# Patient Record
Sex: Female | Born: 1975 | Race: White | Hispanic: No | Smoking: Former smoker
Health system: Southern US, Community
[De-identification: ages and names within clinical notes are randomized; demographics above are authoritative.]

## PROBLEM LIST (undated history)

## (undated) DIAGNOSIS — M549 Dorsalgia, unspecified: Secondary | ICD-10-CM

## (undated) DIAGNOSIS — H18509 Unspecified hereditary corneal dystrophies, unspecified eye: Secondary | ICD-10-CM

## (undated) DIAGNOSIS — R51 Headache: Secondary | ICD-10-CM

## (undated) DIAGNOSIS — K219 Gastro-esophageal reflux disease without esophagitis: Secondary | ICD-10-CM

## (undated) DIAGNOSIS — R519 Headache, unspecified: Secondary | ICD-10-CM

## (undated) DIAGNOSIS — M1990 Unspecified osteoarthritis, unspecified site: Secondary | ICD-10-CM

## (undated) DIAGNOSIS — I10 Essential (primary) hypertension: Secondary | ICD-10-CM

## (undated) HISTORY — PX: EYE SURGERY: SHX253

## (undated) HISTORY — PX: CARPAL TUNNEL RELEASE: SHX101

## (undated) HISTORY — PX: BACK SURGERY: SHX140

## (undated) HISTORY — PX: ABDOMINAL HYSTERECTOMY: SHX81

---
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
## 2005-07-30 ENCOUNTER — Ambulatory Visit: Payer: Self-pay | Admitting: Obstetrics and Gynecology

## 2005-08-21 ENCOUNTER — Emergency Department: Payer: Self-pay | Admitting: Emergency Medicine

## 2005-09-18 ENCOUNTER — Ambulatory Visit: Payer: Self-pay | Admitting: Specialist

## 2006-05-18 ENCOUNTER — Encounter: Admission: RE | Admit: 2006-05-18 | Discharge: 2006-05-18 | Payer: Self-pay | Admitting: Neurological Surgery

## 2006-07-02 ENCOUNTER — Inpatient Hospital Stay (HOSPITAL_COMMUNITY): Admission: RE | Admit: 2006-07-02 | Discharge: 2006-07-04 | Payer: Self-pay | Admitting: Neurological Surgery

## 2006-08-03 ENCOUNTER — Encounter: Admission: RE | Admit: 2006-08-03 | Discharge: 2006-08-03 | Payer: Self-pay | Admitting: Neurological Surgery

## 2006-10-11 ENCOUNTER — Encounter: Admission: RE | Admit: 2006-10-11 | Discharge: 2006-10-11 | Payer: Self-pay | Admitting: Neurological Surgery

## 2007-08-24 IMAGING — CR DG LUMBAR SPINE 2-3V
3 series · 3 of 3 positions shown · non-contrast
Comparison: none

CLINICAL DATA: Posterior laminectomy and fusion, L4-5, with low back and left leg pain.
LUMBAR SPINE ? TWO VIEWS:

[view not recorded (1 of 3)]
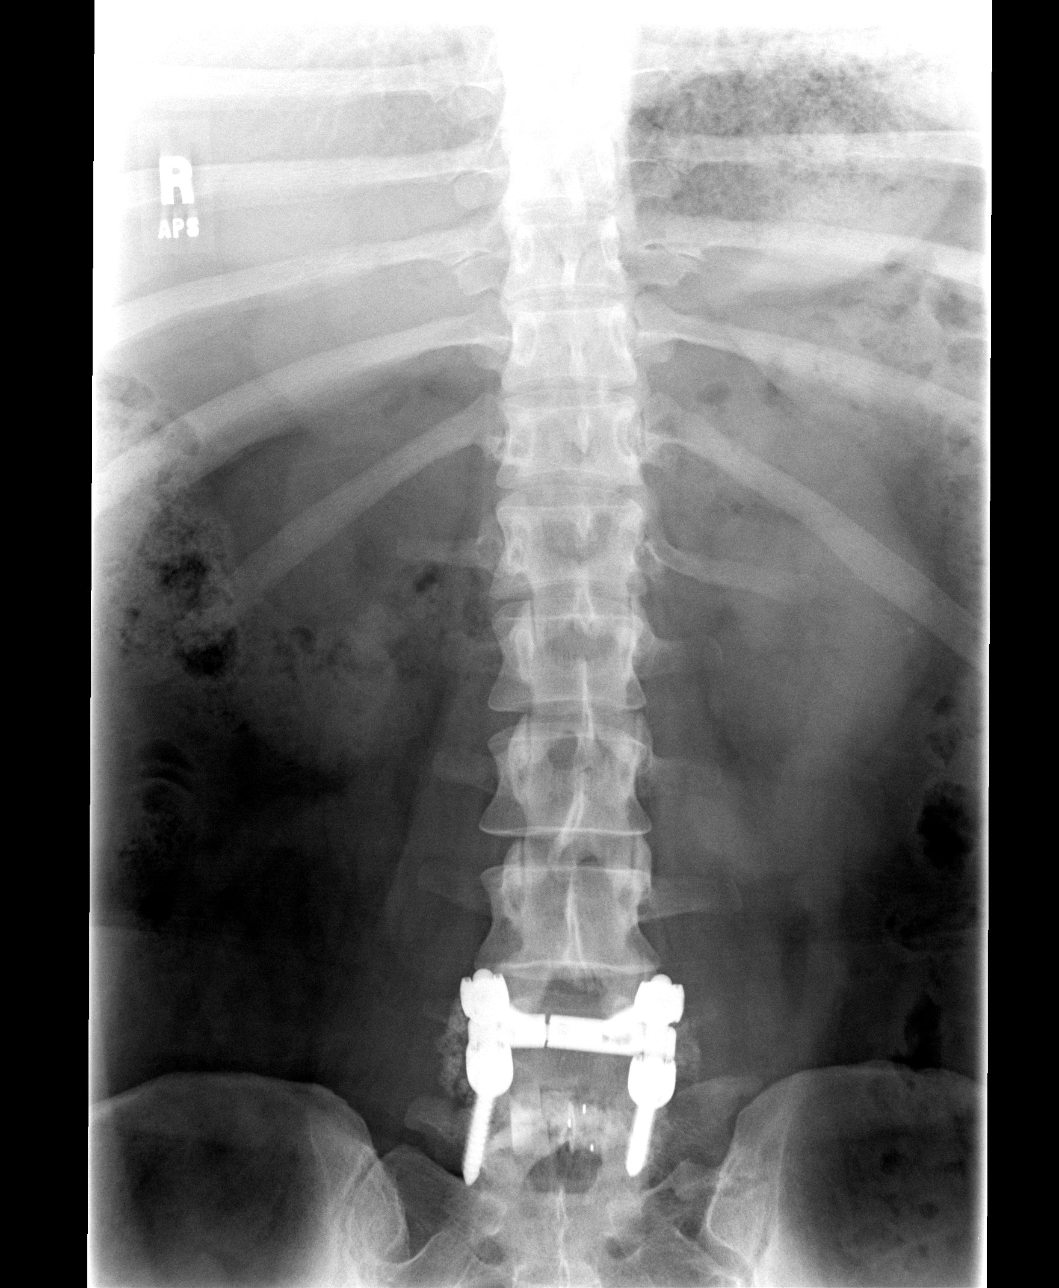

[view not recorded (2 of 3)]
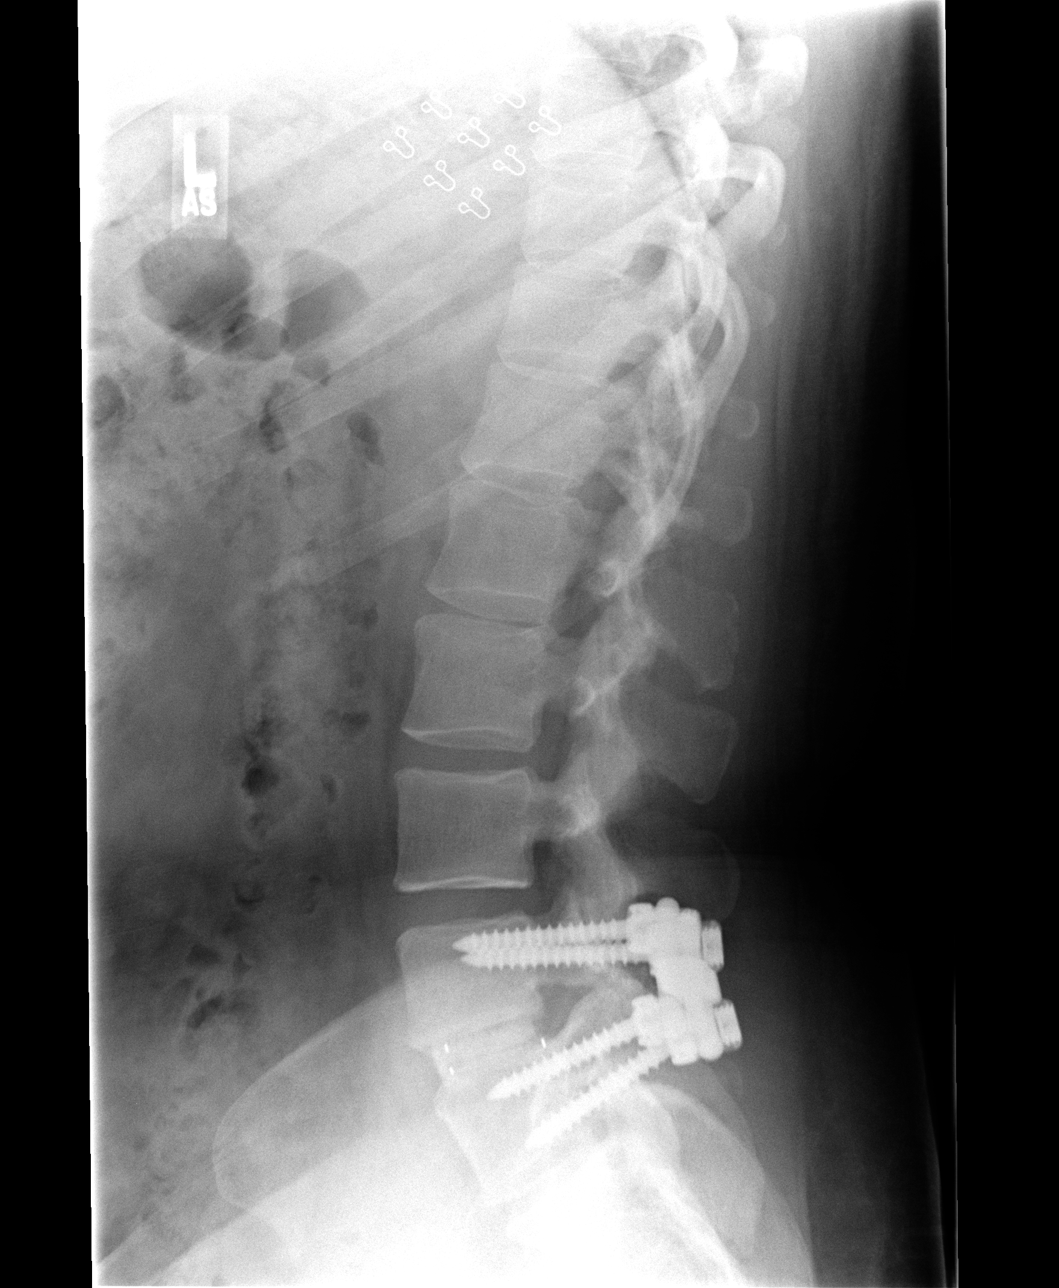

[view not recorded (3 of 3)]
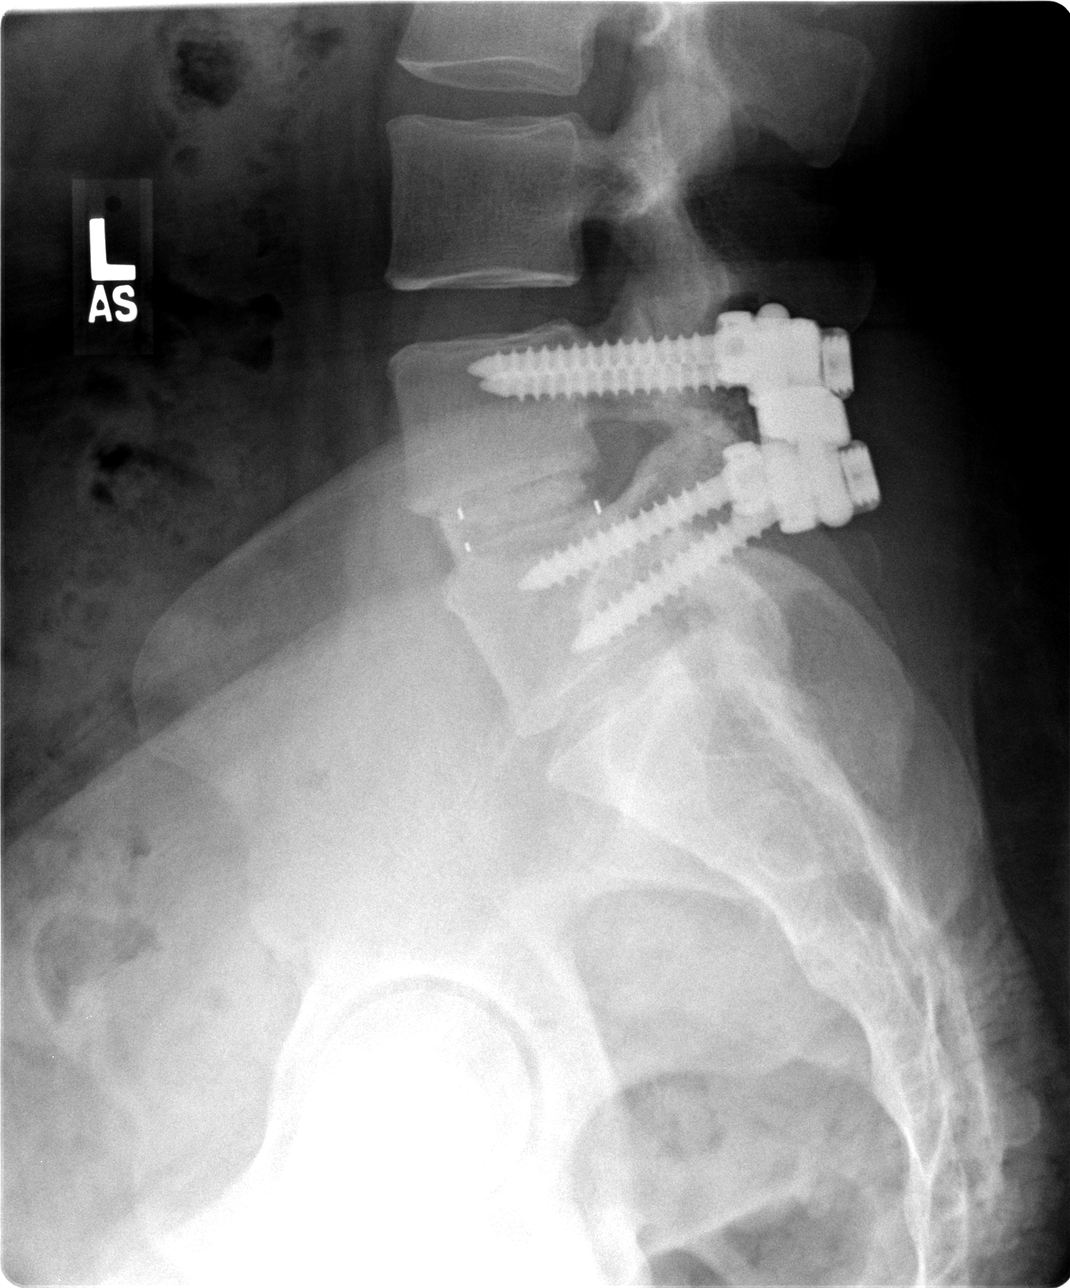

[3 of 3 positions shown; findings below may reference images not displayed]

FINDINGS: Posterior laminectomy with transpedicular screws well placed without surrounding osteolysis at L4-5 noted.  L4-5 interbody bone plug appears normally placed.  Congenitally smaller L5-S1 and/or degenerative disk disease L5-S1 noted.  Remaining lumbar disk spaces and posterior vertebral alignment are normally maintained.
IMPRESSION: 1.  Satisfactory appearing posterior laminectomy and interbody fusion L4-5.
2.  Stable smaller L5-S1 disk.
3.  Otherwise negative.

## 2007-11-04 ENCOUNTER — Emergency Department: Payer: Self-pay

## 2007-11-04 ENCOUNTER — Other Ambulatory Visit: Payer: Self-pay

## 2008-03-06 ENCOUNTER — Emergency Department (HOSPITAL_COMMUNITY): Admission: EM | Admit: 2008-03-06 | Discharge: 2008-03-06 | Payer: Self-pay | Admitting: Emergency Medicine

## 2008-07-31 ENCOUNTER — Emergency Department (HOSPITAL_COMMUNITY): Admission: EM | Admit: 2008-07-31 | Discharge: 2008-07-31 | Payer: Self-pay | Admitting: Emergency Medicine

## 2008-11-19 ENCOUNTER — Emergency Department (HOSPITAL_COMMUNITY): Admission: EM | Admit: 2008-11-19 | Discharge: 2008-11-19 | Payer: Self-pay | Admitting: Emergency Medicine

## 2009-09-19 ENCOUNTER — Emergency Department: Payer: Self-pay | Admitting: Emergency Medicine

## 2010-04-22 ENCOUNTER — Emergency Department (HOSPITAL_COMMUNITY)
Admission: EM | Admit: 2010-04-22 | Discharge: 2010-04-22 | Payer: Self-pay | Source: Home / Self Care | Admitting: Emergency Medicine

## 2010-09-12 NOTE — Discharge Summary (Signed)
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   PROCEDURE:  Posterior lumbar interbody fusion L4-5.   BRIEF HISTORY OF PRESENT ILLNESS:  Ms. Gritz is a 35 year old female who  was referred with severe back pain.  She had undergone a diskogram,  which suggested diskogenic back pain at L4-5 with concordant pain at  that level and non-concordant pain at control level.  She had an MRI and  a CT scan which showed a large paracentral left-sided disk herniation  with central canal stenosis and facet arthropathy.  I recommended a  posterior lumbar interbody fusion.  She understood the risks, benefits,  and suspected outcome and wished to proceed.   HOSPITAL COURSE:  The patient was admitted on July 02, 2006, taken to  the operating room where she underwent a posterior lumbar interbody  fusion L4-5.  The patient tolerated the procedure well and was taken to  the recovery room and then to the floor in stable condition.  For  details of the operative procedure, please see the dictated operative  note.  The patient's hospital course was routine.  There were no  complications.  She remained in the hospital for 2 days.  She was on  Dilaudid p.o. for the first night, which was then discontinued on the  first postoperative day.  Her Foley catheter was discontinued.  She  ambulated to the bathroom without difficulty.  Her pain was fairly well-  controlled on oral pain medications.  She had no more leg pain and  ambulated quite well.  She was  afebrile with stable vital signs.  Her  incision remained clean, dry, and intact.  She was tolerating a regular  diet.  She was discharged to home in a stable condition on July 04, 2006.   FINAL DIAGNOSIS:  Posterior lumbar interbody fusion L4-5.      Tia Alert, MD  Electronically Signed     DSJ/MEDQ  D:  09/03/2006  T:  09/03/2006  Job:  [id number]

## 2010-09-12 NOTE — Op Note (Signed)
NAMEMANA, HABERL               ACCOUNT NO.:  [id number]   MEDICAL RECORD NO.:  [id number]          PATIENT TYPE:  INP   LOCATION:  3172                         FACILITY:  [facility]   PHYSICIAN:  Tia Alert, MD     DATE OF BIRTH:  [birth year]   DATE OF PROCEDURE:  07/02/2006  DATE OF DISCHARGE:                               OPERATIVE REPORT   PREOPERATIVE DIAGNOSIS:  Lumbar disk herniation L4-5 with spinal  stenosis and degenerative disease with mechanical back pain and left leg  pain.   POSTOPERATIVE DIAGNOSIS:  Lumbar disk herniation L4-5 with spinal  stenosis and degenerative disease with mechanical back pain and left leg  pain.   PROCEDURE:  1. Decompressive lumbar laminectomy, hemifacitectomy and      foraminotomies, L4-5 requiring more work than is usually required      for a simple PLIF procedure.  2. Posterior lumbar interbody fusion L4-5 utilizing 10 x 22 mm tangent      interbody bone wedge and a 10 x 26 mm Peak interbody cage packed      with local autograft and Actifuse.  3. Intertransverse arthrodesis L4-5 utilizing local autograft and      Actifuse.  4. Nonsegmental fixation L4-5 utilizing the Legacy pedicle screw      system.   SURGEON:  Dr. Marikay Alar.   ASSISTANT:  Kathaleen Maser. Pool, M.D.   ANESTHESIA:  General endotracheal.   COMPLICATIONS:  None apparent.   INDICATIONS FOR PROCEDURE:  Ms. Kita is a 35 year old female who was  referred with severe back pain.  She had undergone a diskogram which  suggested diskogenic back pain at L4-5 with concordant pain at that  level and not concordant pain at control levels.  She had an MRI and CT  scan which showed a large paracentesis left disk herniation with central  canal stenosis and facet arthropathy.  I recommended a posterior lumbar  interbody fusion L4-5.  She understood the risks, benefits, and expected  outcome and wished to proceed.   DESCRIPTION OF PROCEDURE:  The patient was taken to operating room  and  after induction of adequate generalized endotracheal anesthesia she was  rolled into the prone position on chest rolls and all pressure points  were padded.  Her lumbar region was prepped with DuraPrep and draped in  usual sterile fashion. 10 mL local anesthesia was injected and a dorsal  midline incision was made and carried down to the lumbosacral fascia.  The fascia was opened and the paraspinous musculature was taken down in  subperiosteal fashion to expose the L4-5.  Intraoperative x-ray under  fluoroscopy confirmed my level and I carried the dissection out over the  facettes to expose the transverse processes of L4-L5. I then removed the  spinous process with the Leksell rongeur and then used a Kerrison punch  performed a complete laminectomy, hemifacetectomies at L4-5. The  underlying yellow ligament was removed. The underlying L4-L5 nerve roots  were identified and decompressed distally into the foramina. Epidural  venous vasculature was coagulated and cut sharply and the initial  diskectomy was done with  pituitary rongeurs.  After incising the annulus  bilaterally.  We then distracted the disk space using sequential  distraction up to 10 mm.  We then used rotating cutter, round scraper,  and 10 mm cutting chisel to prepare the endplates for arthrodesis.  The  midline was scraped with Epstein curettes. We then used a 10 mm tangent  interbody bone wedge on the patient's right side and a 10 mm Peak  interbody cage packed with local bone graft and Actifuse and tapped into  position on the patient's left side.  The midline was packed with local  autograft and Actifuse. We then located the pedicle screw entry zones  utilizing surface landmarks and fluoroscopy.  We probed each pedicle  with pedicle probe, tapped each pedicle with a 05/05 tap, then placed  6.5 x 45 mm pedicle screws in the L4 pedicles and L5 pedicles  bilaterally. On the initial tap on the patient's right side of L5  we  felt the tap to be somewhat medial.  There was no injury to the L5 nerve  root but it did breach the medial cortical bone of the L5 pedicle on the  right side.  We then aimed our tap somewhat inferiorly and laterally to  try to stay away from the L5 nerve root. Therefore that screw is  positioned somewhat laterally and somewhat inferiorly but not through  the inferior part of the cortex of the pedicle.  Therefore away from the  L5 nerve root.  We checked this with AP and lateral fluoroscopy and felt  that this was the best position of the screw that we could obtain. We  decided not to remove this screw and extend the fusion down to S1.  The  screw would be sufficient. Therefore we decorticated the transverse  processes of L4-L5 bilaterally.  We placed a mixture of local bone and  Actifuse out over these to perform intertransverse arthrodesis. We  irrigated with saline solution containing Bacitracin, placed lordotic  rods into multiaxial screw heads of the pedicle screws and locked these  into position with a locking caps and antitorque device after achieving  compression on our grafts.  We then inspected our nerve roots once  again, lined the dura with Gelfoam, checked our construct under AP and  lateral fluoroscopy once again. We then placed a medium Hemovac drain  through a separate stab incision and eight closed the muscle and fascia  with #1 Vicryl, closed subcutaneous and subcuticular tissues with 2-0  and 3-0 Vicryl, closed the skin with Benzoin and Steri-Strips.  The  drapes removed.  Sterile dressing was applied.  The patient was awakened  from his general anesthesia and transferred recovery room in stable  condition.  At the end of the procedure all sponge, needle and  instrument counts were correct.      Tia Alert, MD  Electronically Signed     DSJ/MEDQ  D:  07/02/2006  T:  07/02/2006  Job:  [id number]

## 2010-10-06 ENCOUNTER — Emergency Department (HOSPITAL_COMMUNITY): Payer: Self-pay

## 2010-10-06 ENCOUNTER — Emergency Department (HOSPITAL_COMMUNITY)
Admission: EM | Admit: 2010-10-06 | Discharge: 2010-10-06 | Disposition: A | Discharge status: Home / Self Care | Payer: Self-pay | Attending: Emergency Medicine | Admitting: Emergency Medicine

## 2010-10-06 DIAGNOSIS — G8929 Other chronic pain: Secondary | ICD-10-CM | POA: Insufficient documentation

## 2010-10-06 DIAGNOSIS — M549 Dorsalgia, unspecified: Secondary | ICD-10-CM | POA: Insufficient documentation

## 2010-10-06 DIAGNOSIS — R109 Unspecified abdominal pain: Secondary | ICD-10-CM | POA: Insufficient documentation

## 2010-10-06 DIAGNOSIS — J45909 Unspecified asthma, uncomplicated: Secondary | ICD-10-CM | POA: Insufficient documentation

## 2010-10-06 DIAGNOSIS — N83209 Unspecified ovarian cyst, unspecified side: Secondary | ICD-10-CM | POA: Insufficient documentation

## 2010-10-06 DIAGNOSIS — N949 Unspecified condition associated with female genital organs and menstrual cycle: Secondary | ICD-10-CM | POA: Insufficient documentation

## 2010-10-06 DIAGNOSIS — N898 Other specified noninflammatory disorders of vagina: Secondary | ICD-10-CM | POA: Insufficient documentation

## 2010-10-06 LAB — URINALYSIS, ROUTINE W REFLEX MICROSCOPIC
Hgb urine dipstick: NEGATIVE
Ketones, ur: NEGATIVE mg/dL
Nitrite: NEGATIVE
pH: 6.5 (ref 5.0–8.0)

## 2011-01-14 ENCOUNTER — Emergency Department: Payer: Self-pay

## 2011-11-14 ENCOUNTER — Emergency Department: Payer: Self-pay | Admitting: Emergency Medicine

## 2011-12-22 ENCOUNTER — Emergency Department: Payer: Self-pay | Admitting: Emergency Medicine

## 2012-08-25 ENCOUNTER — Emergency Department: Payer: Self-pay | Admitting: Emergency Medicine

## 2012-10-02 ENCOUNTER — Emergency Department: Payer: Self-pay | Admitting: Emergency Medicine

## 2013-01-24 ENCOUNTER — Emergency Department: Payer: Self-pay | Admitting: Emergency Medicine

## 2013-05-05 ENCOUNTER — Ambulatory Visit: Payer: Self-pay | Admitting: Neurology

## 2013-09-04 ENCOUNTER — Emergency Department: Payer: Self-pay | Admitting: Emergency Medicine

## 2013-09-04 LAB — D-DIMER(ARMC): D-Dimer: 627 ng/ml

## 2013-09-04 LAB — CBC
HCT: 40.7 % (ref 35.0–47.0)
HGB: 13.2 g/dL (ref 12.0–16.0)
MCH: 28 pg (ref 26.0–34.0)
MCHC: 32.4 g/dL (ref 32.0–36.0)
MCV: 87 fL (ref 80–100)
PLATELETS: 297 10*3/uL (ref 150–440)
RBC: 4.71 10*6/uL (ref 3.80–5.20)
RDW: 13.5 % (ref 11.5–14.5)
WBC: 10.6 10*3/uL (ref 3.6–11.0)

## 2013-09-04 LAB — BASIC METABOLIC PANEL
ANION GAP: 5 — AB (ref 7–16)
BUN: 12 mg/dL (ref 7–18)
CALCIUM: 9 mg/dL (ref 8.5–10.1)
CHLORIDE: 107 mmol/L (ref 98–107)
CREATININE: 0.78 mg/dL (ref 0.60–1.30)
Co2: 28 mmol/L (ref 21–32)
EGFR (African American): >60
EGFR (Non-African Amer.): >60
Glucose: 126 mg/dL — ABNORMAL HIGH (ref 65–99)
OSMOLALITY: 281 (ref 275–301)
POTASSIUM: 4.1 mmol/L (ref 3.5–5.1)
Sodium: 140 mmol/L (ref 136–145)

## 2013-09-04 LAB — TROPONIN I

## 2013-11-08 ENCOUNTER — Emergency Department: Payer: Self-pay | Admitting: Emergency Medicine

## 2013-11-08 LAB — URINALYSIS, COMPLETE
BLOOD: NEGATIVE
Bacteria: NONE SEEN
Bilirubin,UR: NEGATIVE
GLUCOSE, UR: NEGATIVE mg/dL (ref 0–75)
Ketone: NEGATIVE
Leukocyte Esterase: NEGATIVE
Nitrite: NEGATIVE
PH: 5 (ref 4.5–8.0)
PROTEIN: NEGATIVE
SPECIFIC GRAVITY: 1.023 (ref 1.003–1.030)
WBC UR: 1 /HPF (ref 0–5)

## 2013-11-08 LAB — CBC WITH DIFFERENTIAL/PLATELET
BASOS ABS: 0.1 10*3/uL (ref 0.0–0.1)
Basophil %: 0.8 %
EOS ABS: 0.3 10*3/uL (ref 0.0–0.7)
Eosinophil %: 3.1 %
HCT: 38.1 % (ref 35.0–47.0)
HGB: 12.2 g/dL (ref 12.0–16.0)
Lymphocyte #: 2.2 10*3/uL (ref 1.0–3.6)
Lymphocyte %: 24.5 %
MCH: 28.1 pg (ref 26.0–34.0)
MCHC: 32.1 g/dL (ref 32.0–36.0)
MCV: 88 fL (ref 80–100)
MONO ABS: 0.6 x10 3/mm (ref 0.2–0.9)
MONOS PCT: 6.9 %
Neutrophil #: 5.7 10*3/uL (ref 1.4–6.5)
Neutrophil %: 64.7 %
Platelet: 273 10*3/uL (ref 150–440)
RBC: 4.35 10*6/uL (ref 3.80–5.20)
RDW: 13.8 % (ref 11.5–14.5)
WBC: 8.9 10*3/uL (ref 3.6–11.0)

## 2013-11-08 LAB — COMPREHENSIVE METABOLIC PANEL
ALK PHOS: 53 U/L
ALT: 24 U/L (ref 12–78)
Albumin: 3.4 g/dL (ref 3.4–5.0)
Anion Gap: 4 — ABNORMAL LOW (ref 7–16)
BUN: 15 mg/dL (ref 7–18)
Bilirubin,Total: 0.2 mg/dL (ref 0.2–1.0)
CHLORIDE: 105 mmol/L (ref 98–107)
CREATININE: 0.82 mg/dL (ref 0.60–1.30)
Calcium, Total: 8.9 mg/dL (ref 8.5–10.1)
Co2: 29 mmol/L (ref 21–32)
EGFR (African American): >60
EGFR (Non-African Amer.): >60
GLUCOSE: 100 mg/dL — AB (ref 65–99)
OSMOLALITY: 277 (ref 275–301)
Potassium: 3.7 mmol/L (ref 3.5–5.1)
SGOT(AST): 27 U/L (ref 15–37)
SODIUM: 138 mmol/L (ref 136–145)
TOTAL PROTEIN: 7.1 g/dL (ref 6.4–8.2)

## 2013-11-08 LAB — LIPASE, BLOOD: Lipase: 95 U/L (ref 73–393)

## 2014-02-25 ENCOUNTER — Emergency Department: Payer: Self-pay | Admitting: Emergency Medicine

## 2014-02-25 LAB — CBC WITH DIFFERENTIAL/PLATELET
BASOS PCT: 0.3 %
Basophil #: 0 10*3/uL (ref 0.0–0.1)
EOS ABS: 0.3 10*3/uL (ref 0.0–0.7)
EOS PCT: 3.2 %
HCT: 42.5 % (ref 35.0–47.0)
HGB: 13.9 g/dL (ref 12.0–16.0)
LYMPHS PCT: 21.2 %
Lymphocyte #: 2.2 10*3/uL (ref 1.0–3.6)
MCH: 28.5 pg (ref 26.0–34.0)
MCHC: 32.8 g/dL (ref 32.0–36.0)
MCV: 87 fL (ref 80–100)
MONO ABS: 0.5 x10 3/mm (ref 0.2–0.9)
Monocyte %: 5 %
NEUTROS ABS: 7.4 10*3/uL — AB (ref 1.4–6.5)
Neutrophil %: 70.3 %
PLATELETS: 319 10*3/uL (ref 150–440)
RBC: 4.88 10*6/uL (ref 3.80–5.20)
RDW: 13.6 % (ref 11.5–14.5)
WBC: 10.5 10*3/uL (ref 3.6–11.0)

## 2014-02-25 LAB — COMPREHENSIVE METABOLIC PANEL
ALK PHOS: 65 U/L
AST: 33 U/L (ref 15–37)
Albumin: 3.8 g/dL (ref 3.4–5.0)
Anion Gap: 5 — ABNORMAL LOW (ref 7–16)
BUN: 10 mg/dL (ref 7–18)
Bilirubin,Total: 0.2 mg/dL (ref 0.2–1.0)
CALCIUM: 9 mg/dL (ref 8.5–10.1)
CHLORIDE: 104 mmol/L (ref 98–107)
Co2: 30 mmol/L (ref 21–32)
Creatinine: 0.7 mg/dL (ref 0.60–1.30)
EGFR (African American): >60
EGFR (Non-African Amer.): >60
Glucose: 86 mg/dL (ref 65–99)
OSMOLALITY: 276 (ref 275–301)
Potassium: 4 mmol/L (ref 3.5–5.1)
SGPT (ALT): 37 U/L
Sodium: 139 mmol/L (ref 136–145)
Total Protein: 7.8 g/dL (ref 6.4–8.2)

## 2014-02-25 LAB — URINALYSIS, COMPLETE
BACTERIA: NONE SEEN
BLOOD: NEGATIVE
Bilirubin,UR: NEGATIVE
GLUCOSE, UR: NEGATIVE mg/dL (ref 0–75)
Ketone: NEGATIVE
LEUKOCYTE ESTERASE: NEGATIVE
Nitrite: NEGATIVE
Ph: 5 (ref 4.5–8.0)
Protein: NEGATIVE
RBC,UR: 2 /HPF (ref 0–5)
SPECIFIC GRAVITY: 1.023 (ref 1.003–1.030)
WBC UR: 1 /HPF (ref 0–5)

## 2014-02-25 LAB — LIPASE, BLOOD: LIPASE: 140 U/L (ref 73–393)

## 2014-02-25 LAB — PREGNANCY, URINE: Pregnancy Test, Urine: NEGATIVE m[IU]/mL

## 2014-03-30 ENCOUNTER — Ambulatory Visit (INDEPENDENT_AMBULATORY_CARE_PROVIDER_SITE_OTHER): Payer: BC Managed Care – PPO

## 2014-03-30 ENCOUNTER — Encounter: Payer: Self-pay | Admitting: Podiatry

## 2014-03-30 ENCOUNTER — Ambulatory Visit (INDEPENDENT_AMBULATORY_CARE_PROVIDER_SITE_OTHER): Payer: BC Managed Care – PPO | Admitting: Podiatry

## 2014-03-30 VITALS — BP 116/81 | HR 89 | Resp 16 | Ht 64.0 in | Wt 211.0 lb

## 2014-03-30 DIAGNOSIS — M722 Plantar fascial fibromatosis: Secondary | ICD-10-CM

## 2014-03-30 DIAGNOSIS — M779 Enthesopathy, unspecified: Secondary | ICD-10-CM

## 2014-03-30 DIAGNOSIS — M795 Residual foreign body in soft tissue: Secondary | ICD-10-CM

## 2014-03-30 MED ORDER — TRIAMCINOLONE ACETONIDE 10 MG/ML IJ SUSP
10.0000 mg | Freq: Once | INTRAMUSCULAR | Status: AC
  Administered 2014-03-30: 10 mg

## 2014-03-30 NOTE — Progress Notes (Signed)
   Subjective:    Patient ID: Danielle MayansMelinda K Tran, female    DOB: Apr 21, 1976, 38 y.o.   MRN: [id number]  HPI Comments: i have pain in both feet. On my left foot under my big toe that's been going on for 1 week. Its getting worse. It hurts to walk and stand on it. Ive been using cushions on my foot. On my rt foot its the arch and its been going on for 6 - 8 months. Its getting worse. It hurts to walk and stand. i have bought otc inserts and i sleep in a night brace on both feet.  Foot Pain Associated symptoms include numbness and weakness.      Review of Systems  Constitutional: Positive for unexpected weight change.  Respiratory: Positive for chest tightness.   Cardiovascular:       Calf pain  Gastrointestinal: Positive for diarrhea.       Bloating   Endocrine: Positive for cold intolerance and heat intolerance.  Musculoskeletal: Positive for back pain.       Joint pain Muscle pain  Neurological: Positive for weakness and numbness.  All other systems reviewed and are negative.      Objective:   Physical Exam        Assessment & Plan:

## 2014-04-01 NOTE — Progress Notes (Signed)
Subjective:     Patient ID: Danielle Tran, female   DOB: 22-May-1975, 38 y.o.   MRN: [id number]  HPI patient states I have had problems with my arch of both feet but I developed severe pain underneath my first metatarsal head left and it's making it hard for me to bear any weight on that foot. States it's been going on for a shorter period of time compared to the long-term history of arch and heel pain   Review of Systems  All other systems reviewed and are negative.      Objective:   Physical Exam  Constitutional: She is oriented to person, place, and time.  Cardiovascular: Intact distal pulses.   Musculoskeletal: Normal range of motion.  Neurological: She is oriented to person, place, and time.  Skin: Skin is warm.  Nursing note and vitals reviewed.  neurovascular status intact with muscle strength adequate and range of motion subtalar midtarsal joint within normal limits. Patient is found to have moderate discomfort in the plantar fascia of both feet mid arch area and is noted on the left foot to have a painful keratotic lesion underneath the first metatarsal head with fluid buildup and significant discomfort when palpated     Assessment:     Moderate plantar fasciitis of both feet chronic nature and inflammatory capsulitis sub-first metatarsal head left with inflammatory callus    Plan:     H&P and x-rays reviewed. Today I went ahead and I injected the plantar capsule left first MPJ 3 mg Kenalog 5 mg Xylocaine and then did deep debridement of lesions and applied a small amount of chemical with dressing. Instructed that if symptoms persist to come back or she should develop blister to pop it and apply Neosporin with Band-Aid

## 2014-04-11 ENCOUNTER — Emergency Department: Payer: Self-pay | Admitting: Student

## 2015-07-17 ENCOUNTER — Emergency Department: Payer: BLUE CROSS/BLUE SHIELD

## 2015-07-17 ENCOUNTER — Encounter: Payer: Self-pay | Admitting: Emergency Medicine

## 2015-07-17 DIAGNOSIS — R079 Chest pain, unspecified: Secondary | ICD-10-CM | POA: Insufficient documentation

## 2015-07-17 DIAGNOSIS — Z9889 Other specified postprocedural states: Secondary | ICD-10-CM | POA: Diagnosis not present

## 2015-07-17 DIAGNOSIS — Z8249 Family history of ischemic heart disease and other diseases of the circulatory system: Secondary | ICD-10-CM | POA: Insufficient documentation

## 2015-07-17 DIAGNOSIS — Z833 Family history of diabetes mellitus: Secondary | ICD-10-CM | POA: Insufficient documentation

## 2015-07-17 DIAGNOSIS — Z885 Allergy status to narcotic agent status: Secondary | ICD-10-CM | POA: Insufficient documentation

## 2015-07-17 DIAGNOSIS — Z87892 Personal history of anaphylaxis: Secondary | ICD-10-CM | POA: Diagnosis not present

## 2015-07-17 DIAGNOSIS — R4781 Slurred speech: Secondary | ICD-10-CM | POA: Diagnosis not present

## 2015-07-17 DIAGNOSIS — Z79899 Other long term (current) drug therapy: Secondary | ICD-10-CM | POA: Insufficient documentation

## 2015-07-17 DIAGNOSIS — I10 Essential (primary) hypertension: Secondary | ICD-10-CM | POA: Diagnosis not present

## 2015-07-17 DIAGNOSIS — Z79891 Long term (current) use of opiate analgesic: Secondary | ICD-10-CM | POA: Insufficient documentation

## 2015-07-17 DIAGNOSIS — G894 Chronic pain syndrome: Secondary | ICD-10-CM | POA: Insufficient documentation

## 2015-07-17 DIAGNOSIS — Z888 Allergy status to other drugs, medicaments and biological substances status: Secondary | ICD-10-CM | POA: Diagnosis not present

## 2015-07-17 DIAGNOSIS — H538 Other visual disturbances: Secondary | ICD-10-CM | POA: Insufficient documentation

## 2015-07-17 DIAGNOSIS — E669 Obesity, unspecified: Secondary | ICD-10-CM | POA: Diagnosis not present

## 2015-07-17 DIAGNOSIS — R51 Headache: Secondary | ICD-10-CM | POA: Diagnosis present

## 2015-07-17 DIAGNOSIS — Z6836 Body mass index (BMI) 36.0-36.9, adult: Secondary | ICD-10-CM | POA: Diagnosis not present

## 2015-07-17 DIAGNOSIS — Z791 Long term (current) use of non-steroidal anti-inflammatories (NSAID): Secondary | ICD-10-CM | POA: Insufficient documentation

## 2015-07-17 DIAGNOSIS — R42 Dizziness and giddiness: Secondary | ICD-10-CM | POA: Insufficient documentation

## 2015-07-17 LAB — BASIC METABOLIC PANEL
Anion gap: 7 (ref 5–15)
BUN: 13 mg/dL (ref 6–20)
CHLORIDE: 106 mmol/L (ref 101–111)
CO2: 24 mmol/L (ref 22–32)
CREATININE: 0.69 mg/dL (ref 0.44–1.00)
Calcium: 9 mg/dL (ref 8.9–10.3)
GFR calc Af Amer: >60 mL/min (ref >60)
GFR calc non Af Amer: >60 mL/min (ref >60)
GLUCOSE: 135 mg/dL — AB (ref 65–99)
POTASSIUM: 3.7 mmol/L (ref 3.5–5.1)
SODIUM: 137 mmol/L (ref 135–145)

## 2015-07-17 LAB — CBC
HEMATOCRIT: 40.1 % (ref 35.0–47.0)
Hemoglobin: 13.2 g/dL (ref 12.0–16.0)
MCH: 28.3 pg (ref 26.0–34.0)
MCHC: 32.9 g/dL (ref 32.0–36.0)
MCV: 86.1 fL (ref 80.0–100.0)
PLATELETS: 282 10*3/uL (ref 150–440)
RBC: 4.66 MIL/uL (ref 3.80–5.20)
RDW: 13.8 % (ref 11.5–14.5)
WBC: 9.3 10*3/uL (ref 3.6–11.0)

## 2015-07-17 LAB — TROPONIN I: Troponin I: <0.03 ng/mL (ref <0.031)

## 2015-07-17 NOTE — ED Notes (Signed)
C/o 3 day history of feeling ill.  Reports intermittently feeling dizzy, chest tightness, headache, feels like words are slurring / having difficulty getting words out.  Denies fevers.

## 2015-07-18 ENCOUNTER — Observation Stay
Admission: EM | Admit: 2015-07-18 | Discharge: 2015-07-21 | Disposition: A | Discharge status: Home / Self Care | Payer: BLUE CROSS/BLUE SHIELD | Attending: Internal Medicine | Admitting: Internal Medicine

## 2015-07-18 ENCOUNTER — Observation Stay: Payer: BLUE CROSS/BLUE SHIELD

## 2015-07-18 ENCOUNTER — Encounter: Payer: Self-pay | Admitting: Internal Medicine

## 2015-07-18 ENCOUNTER — Emergency Department: Payer: BLUE CROSS/BLUE SHIELD

## 2015-07-18 DIAGNOSIS — R51 Headache: Secondary | ICD-10-CM | POA: Diagnosis not present

## 2015-07-18 DIAGNOSIS — R519 Headache, unspecified: Secondary | ICD-10-CM

## 2015-07-18 HISTORY — DX: Essential (primary) hypertension: I10

## 2015-07-18 LAB — CSF CELL COUNT WITH DIFFERENTIAL
EOS CSF: 0 %
LYMPHS CSF: 100 %
MONOCYTE-MACROPHAGE-SPINAL FLUID: 0 %
RBC Count, CSF: 0 /mm3 (ref 0–3)
Segmented Neutrophils-CSF: 0 %
Tube #: 3
WBC, CSF: 3 /mm3

## 2015-07-18 LAB — HEMOGLOBIN A1C: Hgb A1c MFr Bld: 6.2 % — ABNORMAL HIGH (ref 4.0–6.0)

## 2015-07-18 LAB — PROTIME-INR
INR: 1.04
Prothrombin Time: 13.8 seconds (ref 11.4–15.0)

## 2015-07-18 LAB — TROPONIN I
Troponin I: <0.03 ng/mL (ref <0.031)
Troponin I: <0.03 ng/mL (ref <0.031)
Troponin I: <0.03 ng/mL (ref <0.031)

## 2015-07-18 LAB — PROTEIN AND GLUCOSE, CSF
GLUCOSE CSF: 68 mg/dL (ref 40–70)
TOTAL PROTEIN, CSF: 22 mg/dL (ref 15–45)

## 2015-07-18 LAB — APTT: aPTT: 29 seconds (ref 24–36)

## 2015-07-18 LAB — TSH: TSH: 2.52 u[IU]/mL (ref 0.350–4.500)

## 2015-07-18 MED ORDER — TRAMADOL HCL 50 MG PO TABS
50.0000 mg | ORAL_TABLET | Freq: Four times a day (QID) | ORAL | Status: DC | PRN
  Administered 2015-07-18 – 2015-07-19 (×2): 50 mg via ORAL
  Filled 2015-07-18 (×2): qty 1

## 2015-07-18 MED ORDER — LISINOPRIL 10 MG PO TABS
10.0000 mg | ORAL_TABLET | Freq: Every day | ORAL | Status: DC
  Administered 2015-07-18 – 2015-07-21 (×4): 10 mg via ORAL
  Filled 2015-07-18 (×4): qty 1

## 2015-07-18 MED ORDER — BACLOFEN 10 MG PO TABS
10.0000 mg | ORAL_TABLET | Freq: Three times a day (TID) | ORAL | Status: DC | PRN
  Administered 2015-07-21: 10 mg via ORAL
  Filled 2015-07-18: qty 1

## 2015-07-18 MED ORDER — ACETAMINOPHEN 325 MG PO TABS: 650.0000 mg | ORAL_TABLET | Freq: Four times a day (QID) | ORAL | Status: DC | PRN

## 2015-07-18 MED ORDER — GABAPENTIN 300 MG PO CAPS
300.0000 mg | ORAL_CAPSULE | Freq: Two times a day (BID) | ORAL | Status: DC
  Administered 2015-07-18 – 2015-07-20 (×5): 300 mg via ORAL
  Filled 2015-07-18 (×7): qty 1

## 2015-07-18 MED ORDER — ENOXAPARIN SODIUM 40 MG/0.4ML ~~LOC~~ SOLN: 40.0000 mg | SUBCUTANEOUS | Status: DC

## 2015-07-18 MED ORDER — ONDANSETRON HCL 4 MG/2ML IJ SOLN
4.0000 mg | Freq: Four times a day (QID) | INTRAMUSCULAR | Status: DC | PRN
  Administered 2015-07-20: 06:00:00 4 mg via INTRAVENOUS
  Filled 2015-07-18: qty 2

## 2015-07-18 MED ORDER — ONDANSETRON HCL 4 MG PO TABS: 4.0000 mg | ORAL_TABLET | Freq: Four times a day (QID) | ORAL | Status: DC | PRN

## 2015-07-18 MED ORDER — IBUPROFEN 400 MG PO TABS
200.0000 mg | ORAL_TABLET | Freq: Four times a day (QID) | ORAL | Status: DC | PRN
  Administered 2015-07-18: 07:00:00 200 mg via ORAL
  Filled 2015-07-18: qty 1

## 2015-07-18 MED ORDER — SUMATRIPTAN SUCCINATE 50 MG PO TABS
25.0000 mg | ORAL_TABLET | Freq: Once | ORAL | Status: AC
Start: 2015-07-18 — End: 2015-07-18
  Administered 2015-07-18: 25 mg via ORAL
  Filled 2015-07-18: qty 1

## 2015-07-18 MED ORDER — OXYCODONE-ACETAMINOPHEN 5-325 MG PO TABS
1.0000 | ORAL_TABLET | Freq: Two times a day (BID) | ORAL | Status: DC | PRN
  Administered 2015-07-18 – 2015-07-21 (×5): 1 via ORAL
  Filled 2015-07-18 (×5): qty 1

## 2015-07-18 MED ORDER — FENTANYL CITRATE (PF) 100 MCG/2ML IJ SOLN
50.0000 ug | Freq: Once | INTRAMUSCULAR | Status: AC
  Administered 2015-07-18: 50 ug via INTRAVENOUS
  Filled 2015-07-18: qty 2

## 2015-07-18 MED ORDER — SODIUM CHLORIDE 0.9 % IV SOLN
INTRAVENOUS | Status: DC
  Administered 2015-07-18 – 2015-07-19 (×2): via INTRAVENOUS

## 2015-07-18 MED ORDER — DOCUSATE SODIUM 100 MG PO CAPS
100.0000 mg | ORAL_CAPSULE | Freq: Two times a day (BID) | ORAL | Status: DC
  Administered 2015-07-20 – 2015-07-21 (×2): 100 mg via ORAL
  Filled 2015-07-18 (×3): qty 1

## 2015-07-18 MED ORDER — VITAMIN D 1000 UNITS PO TABS
1000.0000 [IU] | ORAL_TABLET | Freq: Every day | ORAL | Status: DC
  Administered 2015-07-18 – 2015-07-21 (×4): 1000 [IU] via ORAL
  Filled 2015-07-18 (×4): qty 1

## 2015-07-18 MED ORDER — TRAMADOL HCL 50 MG PO TABS
50.0000 mg | ORAL_TABLET | Freq: Four times a day (QID) | ORAL | Status: DC
  Administered 2015-07-18: 50 mg via ORAL
  Filled 2015-07-18: qty 1

## 2015-07-18 NOTE — Consult Note (Signed)
Reason for Consult:Headache Referring Physician: Luberta Mutter  CC: Headache  HPI: Danielle Tran is an 40 y.o. female with a remote history of headaches that repots she has not had a headache in about 10 years.  About 3 days ago had the onset of a band-like squeezing headache. Describes the headache as intermittent lasting about one minute at a time and then spontaneously relieved for about 5 minutes.  OTC and her home medications did not relieve the headache and she presented for evaluation.  Headache described as throbbing.  Rates pain at a 8/10.  Has associated nausea but no vomiting.  Also reports blurred vision.  Reports no positional relationships.    Past Medical History  Diagnosis Date  . Hypertension     Past Surgical History  Procedure Laterality Date  . Back surgery      Family History  Problem Relation Age of Onset  . Diabetes Mellitus II Father   . Hypertension Father     Social History:  reports that she has never smoked. She does not have any smokeless tobacco history on file. She reports that she drinks alcohol. Her drug history is not on file.  Allergies  Allergen Reactions  . Trazodone Anaphylaxis    Other reaction(s): ANAPHYLAXIS  . Excedrin Extra Strength [Aspirin-Acetaminophen-Caffeine] Other (See Comments)    Hard time breathing  . Guaifenesin     Other reaction(s): VOMITING  . Hydrocodone     Other reaction(s): NAUSEA  . Maprotiline     Other reaction(s): ANAPHYLAXIS  . Morphine     Other reaction(s): VOMITING    Medications:  I have reviewed the patient's current medications. Prior to Admission:  Prescriptions prior to admission  Medication Sig Dispense Refill Last Dose  . baclofen (LIORESAL) 10 MG tablet Take 1 tablet by mouth 3 (three) times daily as needed.  1 PRN at PRN  . Cholecalciferol (VITAMIN D-1000 MAX ST) 1000 UNITS tablet Take 1,000 Units by mouth daily.    07/17/2015 at Unknown time  . gabapentin (NEURONTIN) 300 MG capsule Take 300 mg  by mouth 2 (two) times daily.    07/17/2015 at Unknown time  . ibuprofen (ADVIL,MOTRIN) 200 MG tablet Take 200 mg by mouth every 6 (six) hours as needed.   PRN at PRN  . lisinopril (PRINIVIL,ZESTRIL) 10 MG tablet Take 10 mg by mouth daily.   07/17/2015 at Unknown time  . oxyCODONE-acetaminophen (PERCOCET/ROXICET) 5-325 MG per tablet Take 1 tablet by mouth every 12 (twelve) hours.    07/17/2015 at Unknown time   Scheduled: . cholecalciferol  1,000 Units Oral Daily  . docusate sodium  100 mg Oral BID  . gabapentin  300 mg Oral BID  . lisinopril  10 mg Oral Daily    ROS: History obtained from the patient  General ROS: negative for - chills, fatigue, fever, night sweats, weight gain or weight loss Psychological ROS: negative for - behavioral disorder, hallucinations, memory difficulties, mood swings or suicidal ideation Ophthalmic ROS: as noted in HPI ENT ROS: negative for - epistaxis, nasal discharge, oral lesions, sore throat, tinnitus or vertigo Allergy and Immunology ROS: negative for - hives or itchy/watery eyes Hematological and Lymphatic ROS: negative for - bleeding problems, bruising or swollen lymph nodes Endocrine ROS: negative for - galactorrhea, hair pattern changes, polydipsia/polyuria or temperature intolerance Respiratory ROS: negative for - cough, hemoptysis, shortness of breath or wheezing Cardiovascular ROS: negative for - chest pain, dyspnea on exertion, edema or irregular heartbeat Gastrointestinal ROS: as noted in HPI  Genito-Urinary ROS: negative for - dysuria, hematuria, incontinence or urinary frequency/urgency Musculoskeletal ROS: negative for - joint swelling or muscular weakness Neurological ROS: as noted in HPI Dermatological ROS: negative for rash and skin lesion changes  Physical Examination: Blood pressure 121/74, pulse 69, temperature 97.9 F (36.6 C), temperature source Oral, resp. rate 15, height 5\' 4"  (1.626 m), weight 99.61 kg (219 lb 9.6 oz), SpO2 96  %.  HEENT-  Normocephalic, no lesions, without obvious abnormality.  Normal external eye and conjunctiva.  Normal TM's bilaterally.  Normal auditory canals and external ears. Normal external nose, mucus membranes and septum.  Normal pharynx. Cardiovascular- S1, S2 normal, pulses palpable throughout   Lungs- chest clear, no wheezing, rales, normal symmetric air entry Abdomen- soft, non-tender; bowel sounds normal; no masses,  no organomegaly Extremities- no edema Lymph-no adenopathy palpable Musculoskeletal-no joint tenderness, deformity or swelling Skin-warm and dry, no hyperpigmentation, vitiligo, or suspicious lesions  Neurological Examination Mental Status: Alert, oriented, thought content appropriate.  Speech fluent without evidence of aphasia.  Able to follow 3 step commands without difficulty. Cranial Nerves: II: Discs difficult to visualize; Visual fields grossly normal, pupils equal, round, reactive to light and accommodation III,IV, VI: ptosis not present, extra-ocular motions intact bilaterally V,VII: decreased left NLF, facial light touch sensation normal bilaterally VIII: hearing normal bilaterally IX,X: gag reflex present XI: bilateral shoulder shrug XII: midline tongue extension Motor: Right : Upper extremity   5/5    Left:     Upper extremity   5/5  Lower extremity   5/5     Lower extremity   5/5 Tone and bulk:normal tone throughout; no atrophy noted Sensory: Pinprick and light touch intact throughout, bilaterally Deep Tendon Reflexes: 2+ and symmetric throughout Plantars: Right: downgoing   Left: downgoing Cerebellar: Normal finger-to-nose and normal heel-to-shin testing bilaterally Gait: not tested due to severity of pain.      Laboratory Studies:   Basic Metabolic Panel:  Recent Labs Lab 07/17/15 2149  NA 137  K 3.7  CL 106  CO2 24  GLUCOSE 135*  BUN 13  CREATININE 0.69  CALCIUM 9.0    Liver Function Tests: No results for input(s): AST, ALT,  ALKPHOS, BILITOT, PROT, ALBUMIN in the last 168 hours. No results for input(s): LIPASE, AMYLASE in the last 168 hours. No results for input(s): AMMONIA in the last 168 hours.  CBC:  Recent Labs Lab 07/17/15 2149  WBC 9.3  HGB 13.2  HCT 40.1  MCV 86.1  PLT 282    Cardiac Enzymes:  Recent Labs Lab 07/17/15 2149 07/18/15 0759  TROPONINI <0.03 <0.03    BNP: Invalid input(s): POCBNP  CBG: No results for input(s): GLUCAP in the last 168 hours.  Microbiology: Results for orders placed or performed during the hospital encounter of 10/06/10  Wet prep, genital     Status: Abnormal   Collection Time: 10/06/10  9:28 AM  Result Value Ref Range Status   Yeast Wet Prep HPF POC NONE SEEN NONE SEEN Final   Trich, Wet Prep NONE SEEN NONE SEEN Final   Clue Cells Wet Prep HPF POC MODERATE (A) NONE SEEN Final   WBC, Wet Prep HPF POC NONE SEEN NONE SEEN Final    Coagulation Studies: No results for input(s): LABPROT, INR in the last 72 hours.  Urinalysis: No results for input(s): COLORURINE, LABSPEC, PHURINE, GLUCOSEU, HGBUR, BILIRUBINUR, KETONESUR, PROTEINUR, UROBILINOGEN, NITRITE, LEUKOCYTESUR in the last 168 hours.  Invalid input(s): APPERANCEUR  Lipid Panel:  No results found for: CHOL,  TRIG, HDL, CHOLHDL, VLDL, LDLCALC  HgbA1C: No results found for: HGBA1C  Urine Drug Screen:  No results found for: LABOPIA, COCAINSCRNUR, LABBENZ, AMPHETMU, THCU, LABBARB  Alcohol Level: No results for input(s): ETH in the last 168 hours.  Other results: EKG: normal sinus rhythm at 88 bpm.  Imaging: Dg Chest 2 View  07/17/2015  CLINICAL DATA:  40 year old female with chest pain EXAM: CHEST  2 VIEW COMPARISON:  Chest CT dated 09/04/2013 FINDINGS: The heart size and mediastinal contours are within normal limits. Both lungs are clear. The visualized skeletal structures are unremarkable. IMPRESSION: No active cardiopulmonary disease. Electronically Signed   By: Elgie CollardArash  Radparvar M.D.   On:  07/17/2015 22:11   Ct Head Wo Contrast  07/18/2015  CLINICAL DATA:  Dizziness, headache, and slurred speech for 3 days. EXAM: CT HEAD WITHOUT CONTRAST TECHNIQUE: Contiguous axial images were obtained from the base of the skull through the vertex without intravenous contrast. COMPARISON:  MRI brain 05/05/2013 FINDINGS: Ventricles and sulci appear symmetrical. No ventricular dilatation. No mass effect or midline shift. No abnormal extra-axial fluid collections. Gray-white matter junctions are distinct. Basal cisterns are not effaced. No evidence of acute intracranial hemorrhage. No depressed skull fractures. Visualized paranasal sinuses and mastoid air cells are not opacified. IMPRESSION: No acute intracranial abnormalities. Electronically Signed   By: Burman NievesWilliam  Stevens M.D.   On: 07/18/2015 00:46   Mr Maxine GlennMra Head Wo Contrast  07/18/2015  CLINICAL DATA:  Acute intractable headache, unspecified. Hypertension. EXAM: MRI HEAD WITHOUT CONTRAST MRA HEAD WITHOUT CONTRAST TECHNIQUE: Multiplanar, multiecho pulse sequences of the brain and surrounding structures were obtained without intravenous contrast. Angiographic images of the head were obtained using MRA technique without contrast. COMPARISON:  CT earlier today.  MRI brain 05/05/2013. FINDINGS: MRI HEAD FINDINGS No evidence for acute infarction, hemorrhage, mass lesion, hydrocephalus, or extra-axial fluid. Normal cerebral volume. Minor subcortical white matter disease, nonspecific, stable from 2015. Pituitary, pineal, and cerebellar tonsils unremarkable. No upper cervical lesions. Flow voids are maintained throughout the carotid, basilar, and vertebral arteries. There are no areas of chronic hemorrhage. Visualized calvarium, skull base, and upper cervical osseous structures unremarkable. Scalp and extracranial soft tissues, orbits, sinuses, and mastoids show no acute process. MRA HEAD FINDINGS Internal carotid arteries are widely patent. Basilar artery is widely patent  with both vertebrals contributing, LEFT dominant. No intracranial stenosis or aneurysm. IMPRESSION: No acute intracranial abnormalities. Minor white matter disease, nonspecific. Negative MRA intracranial. Electronically Signed   By: Elsie StainJohn T Curnes M.D.   On: 07/18/2015 09:49   Mr Brain Wo Contrast  07/18/2015  CLINICAL DATA:  Acute intractable headache, unspecified. Hypertension. EXAM: MRI HEAD WITHOUT CONTRAST MRA HEAD WITHOUT CONTRAST TECHNIQUE: Multiplanar, multiecho pulse sequences of the brain and surrounding structures were obtained without intravenous contrast. Angiographic images of the head were obtained using MRA technique without contrast. COMPARISON:  CT earlier today.  MRI brain 05/05/2013. FINDINGS: MRI HEAD FINDINGS No evidence for acute infarction, hemorrhage, mass lesion, hydrocephalus, or extra-axial fluid. Normal cerebral volume. Minor subcortical white matter disease, nonspecific, stable from 2015. Pituitary, pineal, and cerebellar tonsils unremarkable. No upper cervical lesions. Flow voids are maintained throughout the carotid, basilar, and vertebral arteries. There are no areas of chronic hemorrhage. Visualized calvarium, skull base, and upper cervical osseous structures unremarkable. Scalp and extracranial soft tissues, orbits, sinuses, and mastoids show no acute process. MRA HEAD FINDINGS Internal carotid arteries are widely patent. Basilar artery is widely patent with both vertebrals contributing, LEFT dominant. No intracranial stenosis or aneurysm. IMPRESSION:  No acute intracranial abnormalities. Minor white matter disease, nonspecific. Negative MRA intracranial. Electronically Signed   By: Elsie Stain M.D.   On: 07/18/2015 09:49     Assessment/Plan: 40 year old female presenting with headache.  Pain has been intractable.  MRI of the brain personally reviewed and shows no abnormalities.  MRA unremarkable as well.  Due to blurred vision and inability to view discs would like to rule  out the possibility of BIH.    Recommendations: 1.  Ultram  prn headache 2.  LP for opening pressure to rule out BIH   Thana Farr, MD Neurology [phone] 07/18/2015, 11:55 AM

## 2015-07-18 NOTE — H&P (Addendum)
Danielle Tran is an 40 y.o. female.   Chief Complaint: Headache HPI: The patient presents the emergency department complaining of a headache that has lasted for 3 days. She states that it began 3 days ago while at work. She came home to lay down at which time she recalls feeling some chest pain but does not recall actually falling asleep. It is unclear if she passed out. When she awoke later that evening she remembers being unable to move and unable to call for help. She continued to have headache. The patient describes the headache is different from her migraines in the past. She describes a bandlike tightness around her head that squeezes tightly and then relaxes. The pain is 9 out of 10 in severity. She denies nausea or vomiting but admits to seeing floaters in her vision. Her chest pain comes and goes intermittently and lasts for possibly 15 seconds. It is central and substernal and does not radiate. She denies any associated shortness of breath. In the emergency department CT of her head was negative for acute intracranial process however the intractable nature of her headache gave rise to concern for more serious etiology which prompted the emergency department staff to call for admission.  Past Medical History  Diagnosis Date  . Hypertension     Past Surgical History  Procedure Laterality Date  . Back surgery      Family History  Problem Relation Age of Onset  . Diabetes Mellitus II Father   . Hypertension Father    Social History:  reports that she has never smoked. She does not have any smokeless tobacco history on file. She reports that she drinks alcohol. Her drug history is not on file.  Allergies:  Allergies  Allergen Reactions  . Trazodone Anaphylaxis    Other reaction(s): ANAPHYLAXIS  . Excedrin Extra Strength [Aspirin-Acetaminophen-Caffeine] Other (See Comments)    Hard time breathing  . Guaifenesin     Other reaction(s): VOMITING  . Hydrocodone     Other reaction(s):  NAUSEA  . Maprotiline     Other reaction(s): ANAPHYLAXIS  . Morphine     Other reaction(s): VOMITING    Prior to Admission medications   Medication Sig Start Date End Date Taking? Authorizing Provider  baclofen (LIORESAL) 10 MG tablet Take 1 tablet by mouth 3 (three) times daily as needed. 07/01/15  Yes Historical Provider, MD  Cholecalciferol (VITAMIN D-1000 MAX ST) 1000 UNITS tablet Take 1,000 Units by mouth daily.    Yes Historical Provider, MD  gabapentin (NEURONTIN) 300 MG capsule Take 300 mg by mouth 2 (two) times daily.  03/07/14  Yes Historical Provider, MD  ibuprofen (ADVIL,MOTRIN) 200 MG tablet Take 200 mg by mouth every 6 (six) hours as needed.   Yes Historical Provider, MD  lisinopril (PRINIVIL,ZESTRIL) 10 MG tablet Take 10 mg by mouth daily.   Yes Historical Provider, MD  oxyCODONE-acetaminophen (PERCOCET/ROXICET) 5-325 MG per tablet Take 1 tablet by mouth every 12 (twelve) hours.  03/07/14  Yes Historical Provider, MD  '  Results for orders placed or performed during the hospital encounter of 07/18/15 (from the past 48 hour(s))  Basic metabolic panel     Status: Abnormal   Collection Time: 07/17/15  9:49 PM  Result Value Ref Range   Sodium 137 135 - 145 mmol/L   Potassium 3.7 3.5 - 5.1 mmol/L   Chloride 106 101 - 111 mmol/L   CO2 24 22 - 32 mmol/L   Glucose, Bld 135 (H) 65 - 99  mg/dL   BUN 13 6 - 20 mg/dL   Creatinine, Ser 0.69 0.44 - 1.00 mg/dL   Calcium 9.0 8.9 - 10.3 mg/dL   GFR calc non Af Amer >60 >60 mL/min   GFR calc Af Amer >60 >60 mL/min    Comment: (NOTE) The eGFR has been calculated using the CKD EPI equation. This calculation has not been validated in all clinical situations. eGFR's persistently <60 mL/min signify possible Chronic Kidney Disease.    Anion gap 7 5 - 15  CBC     Status: None   Collection Time: 07/17/15  9:49 PM  Result Value Ref Range   WBC 9.3 3.6 - 11.0 K/uL   RBC 4.66 3.80 - 5.20 MIL/uL   Hemoglobin 13.2 12.0 - 16.0 g/dL   HCT 40.1  35.0 - 47.0 %   MCV 86.1 80.0 - 100.0 fL   MCH 28.3 26.0 - 34.0 pg   MCHC 32.9 32.0 - 36.0 g/dL   RDW 13.8 11.5 - 14.5 %   Platelets 282 150 - 440 K/uL  Troponin I     Status: None   Collection Time: 07/17/15  9:49 PM  Result Value Ref Range   Troponin I <0.03 <0.031 ng/mL    Comment:        NO INDICATION OF MYOCARDIAL INJURY.    Dg Chest 2 View  07/17/2015  CLINICAL DATA:  40 year old female with chest pain EXAM: CHEST  2 VIEW COMPARISON:  Chest CT dated 09/04/2013 FINDINGS: The heart size and mediastinal contours are within normal limits. Both lungs are clear. The visualized skeletal structures are unremarkable. IMPRESSION: No active cardiopulmonary disease. Electronically Signed   By: Anner Crete M.D.   On: 07/17/2015 22:11   Ct Head Wo Contrast  07/18/2015  CLINICAL DATA:  Dizziness, headache, and slurred speech for 3 days. EXAM: CT HEAD WITHOUT CONTRAST TECHNIQUE: Contiguous axial images were obtained from the base of the skull through the vertex without intravenous contrast. COMPARISON:  MRI brain 05/05/2013 FINDINGS: Ventricles and sulci appear symmetrical. No ventricular dilatation. No mass effect or midline shift. No abnormal extra-axial fluid collections. Gray-white matter junctions are distinct. Basal cisterns are not effaced. No evidence of acute intracranial hemorrhage. No depressed skull fractures. Visualized paranasal sinuses and mastoid air cells are not opacified. IMPRESSION: No acute intracranial abnormalities. Electronically Signed   By: Lucienne Capers M.D.   On: 07/18/2015 00:46    Review of Systems  Constitutional: Negative for fever and chills.  HENT: Negative for sore throat and tinnitus.   Eyes: Negative for blurred vision and redness.  Respiratory: Negative for cough and shortness of breath.   Cardiovascular: Positive for chest pain. Negative for palpitations, orthopnea and PND.  Gastrointestinal: Negative for nausea, vomiting, abdominal pain and diarrhea.   Genitourinary: Negative for dysuria, urgency and frequency.  Musculoskeletal: Negative for myalgias and joint pain.  Skin: Negative for rash.       No lesions  Neurological: Positive for headaches. Negative for speech change, focal weakness and weakness.  Endo/Heme/Allergies: Does not bruise/bleed easily.       No temperature intolerance  Psychiatric/Behavioral: Negative for depression and suicidal ideas.    Blood pressure 152/116, pulse 84, temperature 98.6 F (37 C), temperature source Oral, resp. rate 10, height '5\' 4"'  (1.626 m), weight 95.255 kg (210 lb), SpO2 97 %. Physical Exam  Vitals reviewed. Constitutional: She is oriented to person, place, and time. She appears well-developed and well-nourished. No distress.  HENT:  Head: Normocephalic and  atraumatic.  Mouth/Throat: Oropharynx is clear and moist.  Eyes: Conjunctivae and EOM are normal. Pupils are equal, round, and reactive to light. No scleral icterus.  Neck: Normal range of motion. Neck supple. No JVD present. No tracheal deviation present. No thyromegaly present.  Cardiovascular: Normal rate, regular rhythm and normal heart sounds.  Exam reveals no gallop and no friction rub.   No murmur heard. Respiratory: Effort normal and breath sounds normal.  GI: Soft. Bowel sounds are normal. She exhibits no distension. There is no tenderness.  Genitourinary:  Deferred  Musculoskeletal: Normal range of motion. She exhibits no edema.  Lymphadenopathy:    She has no cervical adenopathy.  Neurological: She is alert and oriented to person, place, and time. She has normal strength. No cranial nerve deficit or sensory deficit. She exhibits normal muscle tone.  Skin: Skin is warm and dry. No rash noted. No erythema.  Psychiatric: She has a normal mood and affect. Her behavior is normal. Judgment and thought content normal.     Assessment/Plan This is a 40 year old female admitted for intractable headache area 1. Headache: Intractable;  continue ibuprofen. Etiology includes opiate rebound headache. He had been taking oxycodone scheduled to have made it as needed. Await neurology recommendations. I have also ordered an MRI and MRA of the head. Currently the patient has no neurologic deficits. Initially the patient reported word finding difficulty. I find no aphasia or word finding problems on my physical exam. 2. Essential hypertension: Continue lisinopril 3. Chronic pain syndrome: Continue baclofen and Neurontin 4. Obesity: BMI is 36.1; encouraged healthy diet and exercise 5. DVT prophylaxis: Lovenox 6. GI prophylaxis: None The patient is a full code. Time spent on admission was inpatient care approximately 45 minutes  Harrie Foreman, MD 07/18/2015, 3:08 AM

## 2015-07-18 NOTE — ED Provider Notes (Signed)
[facility] Emergency Department Provider Note  ____________________________________________  Time seen: 12:15 AM  I have reviewed the triage vital signs and the nursing notes.   HISTORY  Chief Complaint Headache; Chest Pain; and Dizziness     HPI Danielle Tran is a 40 y.o. female presents with history of generalized headache described as tightness which began on Monday. Patient states that she had an episode of severe headache with inability to move "anything". Patient also states that she's had difficulty finding her words" and speaking since Monday. Patient states current pain score is 8 out of 10.     Past Medical History  Diagnosis Date  . Hypertension     There are no active problems to display for this patient.   Surgical history none  Current Outpatient Rx  Name  Route  Sig  Dispense  Refill  . ALPRAZolam (XANAX) 0.25 MG tablet      TAKE 1 TABLET BY MOUTH TWICE A DAY AS NEEDED         . Cholecalciferol (VITAMIN D-1000 MAX ST) 1000 UNITS tablet   Oral   Take by mouth.         . gabapentin (NEURONTIN) 300 MG capsule   Oral   Take 300 mg by mouth.         Marland Kitchen lisinopril (PRINIVIL,ZESTRIL) 20 MG tablet   Oral   Take 10 mg by mouth.         . methocarbamol (ROBAXIN) 500 MG tablet   Oral   Take 500 mg by mouth.         . oxyCODONE-acetaminophen (PERCOCET/ROXICET) 5-325 MG per tablet   Oral   Take by mouth.         . vitamin B-12 (CYANOCOBALAMIN) 1000 MCG tablet   Oral   Take by mouth.         . zolpidem (AMBIEN) 5 MG tablet                 Allergies Excedrin extra strength; Guaifenesin; Hydrocodone; Maprotiline; Morphine; and Trazodone  No family history on file.  Social History Social History  Substance Use Topics  . Smoking status: Never Smoker   . Smokeless tobacco: None  . Alcohol Use: 0.0 oz/week    0 Standard drinks or equivalent per week    Review of Systems  Constitutional: Negative  for fever. Eyes: Negative for visual changes. ENT: Negative for sore throat. Cardiovascular: Negative for chest pain. Respiratory: Negative for shortness of breath. Gastrointestinal: Negative for abdominal pain, vomiting and diarrhea. Genitourinary: Negative for dysuria. Musculoskeletal: Negative for back pain. Skin: Negative for rash. Neurological: Negative for headaches, focal weakness or numbness.   10-point ROS otherwise negative.  ____________________________________________   PHYSICAL EXAM:  VITAL SIGNS: ED Triage Vitals  Enc Vitals Group     BP 07/17/15 2151 142/91 mmHg     Pulse Rate 07/17/15 2151 88     Resp 07/17/15 2151 16     Temp 07/17/15 2151 98.6 F (37 C)     Temp Source 07/17/15 2151 Oral     SpO2 07/17/15 2151 95 %     Weight 07/17/15 2151 210 lb (95.255 kg)     Height 07/17/15 2151  (1.626 m)     Head Cir --      Peak Flow --      Pain Score 07/17/15 2147 7     Pain Loc --      Pain Edu? --  Excl. in GC? --      Constitutional: Alert and oriented. Well appearing and in no distress. Eyes: Conjunctivae are normal. PERRL. Normal extraocular movements. ENT   Head: Normocephalic and atraumatic.   Nose: No congestion/rhinnorhea.   Mouth/Throat: Mucous membranes are moist.   Neck: No stridor. Hematological/Lymphatic/Immunilogical: No cervical lymphadenopathy. Cardiovascular: Normal rate, regular rhythm. Normal and symmetric distal pulses are present in all extremities. No murmurs, rubs, or gallops. Respiratory: Normal respiratory effort without tachypnea nor retractions. Breath sounds are clear and equal bilaterally. No wheezes/rales/rhonchi. Gastrointestinal: Soft and nontender. No distention. There is no CVA tenderness. Genitourinary: deferred Musculoskeletal: Nontender with normal range of motion in all extremities. No joint effusions.  No lower extremity tenderness nor edema. Neurologic:  Normal speech and language. No gross  focal neurologic deficits are appreciated. Speech is normal.  Skin:  Skin is warm, dry and intact. No rash noted. Psychiatric: Mood and affect are normal. Speech and behavior are normal. Patient exhibits appropriate insight and judgment.  ____________________________________________    LABS (pertinent positives/negatives)  Labs Reviewed  BASIC METABOLIC PANEL - Abnormal; Notable for the following:    Glucose, Bld 135 (*)    All other components within normal limits  CBC  TROPONIN I     ____________________________________________   EKG  ED ECG REPORT I, [place] N BROWN, the attending physician, personally viewed and interpreted this ECG.   Date: 07/18/2015  EKG Time: 9:47 PM  Rate: 88  Rhythm: Normal sinus rhythm  Axis: None  Intervals: Normal  ST&T Change: None   ____________________________________________    RADIOLOGY    CT Head Wo Contrast (Final result) Result time: 07/18/15 00:46:49   Final result by Rad Results In Interface (07/18/15 00:46:49)   Narrative:   CLINICAL DATA: Dizziness, headache, and slurred speech for 3 days.  EXAM: CT HEAD WITHOUT CONTRAST  TECHNIQUE: Contiguous axial images were obtained from the base of the skull through the vertex without intravenous contrast.  COMPARISON: MRI brain 05/05/2013  FINDINGS: Ventricles and sulci appear symmetrical. No ventricular dilatation. No mass effect or midline shift. No abnormal extra-axial fluid collections. Gray-white matter junctions are distinct. Basal cisterns are not effaced. No evidence of acute intracranial hemorrhage. No depressed skull fractures. Visualized paranasal sinuses and mastoid air cells are not opacified.  IMPRESSION: No acute intracranial abnormalities.   Electronically Signed By: Burman Nieves M.D. On: 07/18/2015 00:46          DG Chest 2 View (Final result) Result time: 07/17/15 22:12:21   Final result by Rad Results In Interface (07/17/15  22:12:21)   Narrative:   CLINICAL DATA: 40 year old female with chest pain  EXAM: CHEST 2 VIEW  COMPARISON: Chest CT dated 09/04/2013  FINDINGS: The heart size and mediastinal contours are within normal limits. Both lungs are clear. The visualized skeletal structures are unremarkable.  IMPRESSION: No active cardiopulmonary disease.   Electronically Signed By: Elgie Collard M.D. On: 07/17/2015 22:11        INITIAL IMPRESSION / ASSESSMENT AND PLAN / ED COURSE  Pertinent labs & imaging results that were available during my care of the patient were reviewed by me and considered in my medical decision making (see chart for details).  ----------------------------------------- 1:54 AM on 07/18/2015 -----------------------------------------  Patient with ongoing headache and word finding difficulty. CT scan of the head revealed no acute intracranial abnormalities however given ongoing symptomatology concern for possible CVA/TIA as such patient will be admitted to the hospital. Patient discussed with Dr. Sheryle Hail for hospital admission for  further evaluation and management.  ____________________________________________   FINAL CLINICAL IMPRESSION(S) / ED DIAGNOSES  Final diagnoses:  Acute nonintractable headache, unspecified headache type      Darci Currentandolph N Brown, MD 07/18/15 [phone]

## 2015-07-18 NOTE — Progress Notes (Signed)
[facility] [place] [facility]   PATIENT NAME: Danielle SimmondsMelinda Tran    MR#:  [id number]  DATE OF BIRTH:  1975/11/01  SUBJECTIVE:admitted for severe headache.says it feels different than migraine.has some nausea and photophobia.had bank like squeezing type of pain every 5 min and gets relieved .  CHIEF COMPLAINT:   Chief Complaint  Patient presents with  . Headache  . Chest Pain  . Dizziness    REVIEW OF SYSTEMS:    ROS  Nutrition: Tolerating Diet: Tolerating PT:      DRUG ALLERGIES:   Allergies  Allergen Reactions  . Trazodone Anaphylaxis    Other reaction(s): ANAPHYLAXIS  . Excedrin Extra Strength [Aspirin-Acetaminophen-Caffeine] Other (See Comments)    Hard time breathing  . Guaifenesin     Other reaction(s): VOMITING  . Hydrocodone     Other reaction(s): NAUSEA  . Maprotiline     Other reaction(s): ANAPHYLAXIS  . Morphine     Other reaction(s): VOMITING    VITALS:  Blood pressure 133/71, pulse 88, temperature 98.1 F (36.7 C), temperature source Oral, resp. rate 15, height 5\' 4"  (1.626 m), weight 99.61 kg (219 lb 9.6 oz), SpO2 98 %.  PHYSICAL EXAMINATION:   Physical Exam  GENERAL:  40 y.o.-year-old patient lying in the bed with no acute distress.  EYES: Pupils equal, round, reactive to light and accommodation. No scleral icterus. Extraocular muscles intact.  HEENT: Head atraumatic, normocephalic. Oropharynx and nasopharynx clear.  NECK:  Supple, no jugular venous distention. No thyroid enlargement, no tenderness.  LUNGS: Normal breath sounds bilaterally, no wheezing, rales,rhonchi or crepitation. No use of accessory muscles of respiration.  CARDIOVASCULAR: S1, S2 normal. No murmurs, rubs, or gallops.  ABDOMEN: Soft, nontender, nondistended. Bowel sounds present. No organomegaly or mass.  EXTREMITIES: No pedal edema, cyanosis, or clubbing.  NEUROLOGIC: Cranial nerves II through XII are intact. Muscle strength 5/5 in all  extremities. Sensation intact. Gait not checked.  PSYCHIATRIC: The patient is alert and oriented x 3.  SKIN: No obvious rash, lesion, or ulcer.    LABORATORY PANEL:   CBC  Recent Labs Lab 07/17/15 2149  WBC 9.3  HGB 13.2  HCT 40.1  PLT 282   ------------------------------------------------------------------------------------------------------------------  Chemistries   Recent Labs Lab 07/17/15 2149  NA 137  K 3.7  CL 106  CO2 24  GLUCOSE 135*  BUN 13  CREATININE 0.69  CALCIUM 9.0   ------------------------------------------------------------------------------------------------------------------  Cardiac Enzymes  Recent Labs Lab 07/18/15 1235  TROPONINI <0.03   ------------------------------------------------------------------------------------------------------------------  RADIOLOGY:  Dg Chest 2 View  07/17/2015  CLINICAL DATA:  40 year old female with chest pain EXAM: CHEST  2 VIEW COMPARISON:  Chest CT dated 09/04/2013 FINDINGS: The heart size and mediastinal contours are within normal limits. Both lungs are clear. The visualized skeletal structures are unremarkable. IMPRESSION: No active cardiopulmonary disease. Electronically Signed   By: Elgie CollardArash  Radparvar M.D.   On: 07/17/2015 22:11   Ct Head Wo Contrast  07/18/2015  CLINICAL DATA:  Dizziness, headache, and slurred speech for 3 days. EXAM: CT HEAD WITHOUT CONTRAST TECHNIQUE: Contiguous axial images were obtained from the base of the skull through the vertex without intravenous contrast. COMPARISON:  MRI brain 05/05/2013 FINDINGS: Ventricles and sulci appear symmetrical. No ventricular dilatation. No mass effect or midline shift. No abnormal extra-axial fluid collections. Gray-white matter junctions are distinct. Basal cisterns are not effaced. No evidence of acute intracranial hemorrhage. No depressed skull fractures. Visualized paranasal sinuses and mastoid air cells are not opacified. IMPRESSION: No  acute  intracranial abnormalities. Electronically Signed   By: Burman Nieves M.D.   On: 07/18/2015 00:46   Mr Maxine Glenn Head Wo Contrast  07/18/2015  CLINICAL DATA:  Acute intractable headache, unspecified. Hypertension. EXAM: MRI HEAD WITHOUT CONTRAST MRA HEAD WITHOUT CONTRAST TECHNIQUE: Multiplanar, multiecho pulse sequences of the brain and surrounding structures were obtained without intravenous contrast. Angiographic images of the head were obtained using MRA technique without contrast. COMPARISON:  CT earlier today.  MRI brain 05/05/2013. FINDINGS: MRI HEAD FINDINGS No evidence for acute infarction, hemorrhage, mass lesion, hydrocephalus, or extra-axial fluid. Normal cerebral volume. Minor subcortical white matter disease, nonspecific, stable from 2015. Pituitary, pineal, and cerebellar tonsils unremarkable. No upper cervical lesions. Flow voids are maintained throughout the carotid, basilar, and vertebral arteries. There are no areas of chronic hemorrhage. Visualized calvarium, skull base, and upper cervical osseous structures unremarkable. Scalp and extracranial soft tissues, orbits, sinuses, and mastoids show no acute process. MRA HEAD FINDINGS Internal carotid arteries are widely patent. Basilar artery is widely patent with both vertebrals contributing, LEFT dominant. No intracranial stenosis or aneurysm. IMPRESSION: No acute intracranial abnormalities. Minor white matter disease, nonspecific. Negative MRA intracranial. Electronically Signed   By: Elsie Stain M.D.   On: 07/18/2015 09:49   Mr Brain Wo Contrast  07/18/2015  CLINICAL DATA:  Acute intractable headache, unspecified. Hypertension. EXAM: MRI HEAD WITHOUT CONTRAST MRA HEAD WITHOUT CONTRAST TECHNIQUE: Multiplanar, multiecho pulse sequences of the brain and surrounding structures were obtained without intravenous contrast. Angiographic images of the head were obtained using MRA technique without contrast. COMPARISON:  CT earlier today.  MRI brain  05/05/2013. FINDINGS: MRI HEAD FINDINGS No evidence for acute infarction, hemorrhage, mass lesion, hydrocephalus, or extra-axial fluid. Normal cerebral volume. Minor subcortical white matter disease, nonspecific, stable from 2015. Pituitary, pineal, and cerebellar tonsils unremarkable. No upper cervical lesions. Flow voids are maintained throughout the carotid, basilar, and vertebral arteries. There are no areas of chronic hemorrhage. Visualized calvarium, skull base, and upper cervical osseous structures unremarkable. Scalp and extracranial soft tissues, orbits, sinuses, and mastoids show no acute process. MRA HEAD FINDINGS Internal carotid arteries are widely patent. Basilar artery is widely patent with both vertebrals contributing, LEFT dominant. No intracranial stenosis or aneurysm. IMPRESSION: No acute intracranial abnormalities. Minor white matter disease, nonspecific. Negative MRA intracranial. Electronically Signed   By: Elsie Stain M.D.   On: 07/18/2015 09:49     ASSESSMENT AND PLAN:   Active Problems:   Intractable headache  1.intractable headache.MRIbrain,MRA brain are negative.LP today to evaluate for  Benign intracranial htn;continue Ultram ;      All the records are reviewed and case discussed with Care Management/Social Workerr. Management plans discussed with the patient, family and they are in agreement.  CODE STATUS:full  TOTAL TIME TAKING CARE OF THIS PATIENT: .   POSSIBLE D/C IN 1-2DAYS, DEPENDING ON CLINICAL CONDITION.   Katha Hamming M.D on 07/18/2015 at 3:08 PM  Between 7am to 6pm - Pager - [phone]  After 6pm go to [email or web address] - password [email or web address] [facility]  [address] [facility]  Office  [phone]  CC: Primary care physician; Leotis Shames, MD

## 2015-07-18 NOTE — ED Notes (Addendum)
Pt states tightness in head (q625min, lasting 1min), pressure in head. Pt is congested. Pt has been sick since Monday. States she could not move Monday, could not call for son. Pt states she has had difficulty finding words and speaking. Pt states Monday she felt groggy, was out and would wake up, eat crackers, then fall back asleep. Pt talking in complete sentences at this moment, laying on stretcher. Pt states HTN, chronic pain for back and shoulder, arthritis. 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 States family hx of stroke and aneurysm.

## 2015-07-18 NOTE — Progress Notes (Signed)
Pt states she took 1600 mg of Ibuprofen on Tuesday night. States she takes that amount every once in a while when she really needs it.

## 2015-07-19 DIAGNOSIS — R51 Headache: Secondary | ICD-10-CM | POA: Diagnosis not present

## 2015-07-19 LAB — HCG, QUANTITATIVE, PREGNANCY: HCG, BETA CHAIN, QUANT, S: 1 m[IU]/mL (ref <5)

## 2015-07-19 MED ORDER — DIVALPROEX SODIUM 500 MG PO DR TAB
500.0000 mg | DELAYED_RELEASE_TABLET | Freq: Once | ORAL | Status: AC
Start: 2015-07-19 — End: 2015-07-19
  Administered 2015-07-19: 500 mg via ORAL
  Filled 2015-07-19: qty 1

## 2015-07-19 MED ORDER — SUMATRIPTAN SUCCINATE 25 MG PO TABS
25.0000 mg | ORAL_TABLET | Freq: Once | ORAL | Status: AC
  Administered 2015-07-19: 25 mg via ORAL
  Filled 2015-07-19 (×2): qty 1

## 2015-07-19 MED ORDER — DEXAMETHASONE SODIUM PHOSPHATE 10 MG/ML IJ SOLN
10.0000 mg | Freq: Once | INTRAMUSCULAR | Status: AC
  Administered 2015-07-19: 10:00:00 10 mg via INTRAVENOUS
  Filled 2015-07-19: qty 1

## 2015-07-19 MED ORDER — ACETAZOLAMIDE ER 500 MG PO CP12
1000.0000 mg | ORAL_CAPSULE | Freq: Two times a day (BID) | ORAL | Status: DC
  Filled 2015-07-19 (×2): qty 2

## 2015-07-19 MED ORDER — MAGNESIUM SULFATE 50 % IJ SOLN
3.0000 g | Freq: Once | INTRAVENOUS | Status: AC
  Administered 2015-07-19: 10:00:00 3 g via INTRAVENOUS
  Filled 2015-07-19: qty 6

## 2015-07-19 MED ORDER — ACETAZOLAMIDE 250 MG PO TABS
500.0000 mg | ORAL_TABLET | Freq: Three times a day (TID) | ORAL | Status: DC
  Administered 2015-07-19 – 2015-07-21 (×6): 500 mg via ORAL
  Filled 2015-07-19 (×10): qty 2

## 2015-07-19 NOTE — Consult Note (Signed)
Reason for Consult:Headache Referring Physician: Luberta Mutter  CC: Headache  Headache improved. S/p LP with opening pressure of 22 and closing 15. Headache worse with sitting up.    Past Medical History  Diagnosis Date  . Hypertension     Past Surgical History  Procedure Laterality Date  . Back surgery      Family History  Problem Relation Age of Onset  . Diabetes Mellitus II Father   . Hypertension Father     Social History:  reports that she has never smoked. She does not have any smokeless tobacco history on file. She reports that she drinks alcohol. Her drug history is not on file.  Allergies  Allergen Reactions  . Trazodone Anaphylaxis    Other reaction(s): ANAPHYLAXIS  . Excedrin Extra Strength [Aspirin-Acetaminophen-Caffeine] Other (See Comments)    Hard time breathing  . Guaifenesin     Other reaction(s): VOMITING  . Hydrocodone     Other reaction(s): NAUSEA  . Maprotiline     Other reaction(s): ANAPHYLAXIS  . Morphine     Other reaction(s): VOMITING    Medications:  I have reviewed the patient's current medications. Prior to Admission:  Prescriptions prior to admission  Medication Sig Dispense Refill Last Dose  . baclofen (LIORESAL) 10 MG tablet Take 1 tablet by mouth 3 (three) times daily as needed.  1 PRN at PRN  . Cholecalciferol (VITAMIN D-1000 MAX ST) 1000 UNITS tablet Take 1,000 Units by mouth daily.    07/17/2015 at Unknown time  . gabapentin (NEURONTIN) 300 MG capsule Take 300 mg by mouth 2 (two) times daily.    07/17/2015 at Unknown time  . ibuprofen (ADVIL,MOTRIN) 200 MG tablet Take 200 mg by mouth every 6 (six) hours as needed.   PRN at PRN  . lisinopril (PRINIVIL,ZESTRIL) 10 MG tablet Take 10 mg by mouth daily.   07/17/2015 at Unknown time  . oxyCODONE-acetaminophen (PERCOCET/ROXICET) 5-325 MG per tablet Take 1 tablet by mouth every 12 (twelve) hours.    07/17/2015 at Unknown time   Scheduled: . cholecalciferol  1,000 Units Oral Daily  . docusate  sodium  100 mg Oral BID  . gabapentin  300 mg Oral BID  . lisinopril  10 mg Oral Daily    ROS: History obtained from the patient  General ROS: negative for - chills, fatigue, fever, night sweats, weight gain or weight loss Psychological ROS: negative for - behavioral disorder, hallucinations, memory difficulties, mood swings or suicidal ideation Ophthalmic ROS: as noted in HPI ENT ROS: negative for - epistaxis, nasal discharge, oral lesions, sore throat, tinnitus or vertigo Allergy and Immunology ROS: negative for - hives or itchy/watery eyes Hematological and Lymphatic ROS: negative for - bleeding problems, bruising or swollen lymph nodes Endocrine ROS: negative for - galactorrhea, hair pattern changes, polydipsia/polyuria or temperature intolerance Respiratory ROS: negative for - cough, hemoptysis, shortness of breath or wheezing Cardiovascular ROS: negative for - chest pain, dyspnea on exertion, edema or irregular heartbeat Gastrointestinal ROS: as noted in HPI Genito-Urinary ROS: negative for - dysuria, hematuria, incontinence or urinary frequency/urgency Musculoskeletal ROS: negative for - joint swelling or muscular weakness Neurological ROS: as noted in HPI Dermatological ROS: negative for rash and skin lesion changes  Physical Examination: Blood pressure 94/52, pulse 72, temperature 97.9 F (36.6 C), temperature source Oral, resp. rate 16, height 5\' 4"  (1.626 m), weight 220 lb 1.6 oz (99.837 kg), SpO2 97 %.  HEENT-  Normocephalic, no lesions, without obvious abnormality.  Normal external eye and conjunctiva.  Normal  TM's bilaterally.  Normal auditory canals and external ears. Normal external nose, mucus membranes and septum.  Normal pharynx. Cardiovascular- S1, S2 normal, pulses palpable throughout   Lungs- chest clear, no wheezing, rales, normal symmetric air entry Abdomen- soft, non-tender; bowel sounds normal; no masses,  no organomegaly Extremities- no edema Lymph-no  adenopathy palpable Musculoskeletal-no joint tenderness, deformity or swelling Skin-warm and dry, no hyperpigmentation, vitiligo, or suspicious lesions  Neurological Examination Mental Status: Alert, oriented, thought content appropriate.  Speech fluent without evidence of aphasia.  Able to follow 3 step commands without difficulty. Cranial Nerves: II: Discs difficult to visualize; Visual fields grossly normal, pupils equal, round, reactive to light and accommodation III,IV, VI: ptosis not present, extra-ocular motions intact bilaterally V,VII: decreased left NLF, facial light touch sensation normal bilaterally VIII: hearing normal bilaterally IX,X: gag reflex present XI: bilateral shoulder shrug XII: midline tongue extension Motor: Right : Upper extremity   5/5    Left:     Upper extremity   5/5  Lower extremity   5/5     Lower extremity   5/5 Tone and bulk:normal tone throughout; no atrophy noted Sensory: Pinprick and light touch intact throughout, bilaterally Deep Tendon Reflexes: 2+ and symmetric throughout Plantars: Right: downgoing   Left: downgoing Cerebellar: Normal finger-to-nose and normal heel-to-shin testing bilaterally Gait: not tested due to severity of pain.      Laboratory Studies:   Basic Metabolic Panel:  Recent Labs Lab 07/17/15 2149  NA 137  K 3.7  CL 106  CO2 24  GLUCOSE 135*  BUN 13  CREATININE 0.69  CALCIUM 9.0    Liver Function Tests: No results for input(s): AST, ALT, ALKPHOS, BILITOT, PROT, ALBUMIN in the last 168 hours. No results for input(s): LIPASE, AMYLASE in the last 168 hours. No results for input(s): AMMONIA in the last 168 hours.  CBC:  Recent Labs Lab 07/17/15 2149  WBC 9.3  HGB 13.2  HCT 40.1  MCV 86.1  PLT 282    Cardiac Enzymes:  Recent Labs Lab 07/17/15 2149 07/18/15 0759 07/18/15 1235 07/18/15 1820  TROPONINI <0.03 <0.03 <0.03 <0.03    BNP: Invalid input(s): POCBNP  CBG: No results for input(s):  GLUCAP in the last 168 hours.  Microbiology: Results for orders placed or performed during the hospital encounter of 07/18/15  CSF culture     Status: None (Preliminary result)   Collection Time: 07/18/15  2:27 PM  Result Value Ref Range Status   Specimen Description CSF  Final   Special Requests Normal  Final   Gram Stain NO ORGANISMS SEEN  Final   Culture NO GROWTH < 24 HOURS  Final   Report Status PENDING  Incomplete    Coagulation Studies:  Recent Labs  07/18/15 1235  LABPROT 13.8  INR 1.04    Urinalysis: No results for input(s): COLORURINE, LABSPEC, PHURINE, GLUCOSEU, HGBUR, BILIRUBINUR, KETONESUR, PROTEINUR, UROBILINOGEN, NITRITE, LEUKOCYTESUR in the last 168 hours.  Invalid input(s): APPERANCEUR  Lipid Panel:  No results found for: CHOL, TRIG, HDL, CHOLHDL, VLDL, LDLCALC  HgbA1C:  Lab Results  Component Value Date   HGBA1C 6.2* 07/18/2015    Urine Drug Screen:  No results found for: LABOPIA, COCAINSCRNUR, LABBENZ, AMPHETMU, THCU, LABBARB  Alcohol Level: No results for input(s): ETH in the last 168 hours.  Other results: EKG: normal sinus rhythm at 88 bpm.  Imaging: Dg Chest 2 View  07/17/2015  CLINICAL DATA:  40 year old female with chest pain EXAM: CHEST  2 VIEW COMPARISON:  Chest  CT dated 09/04/2013 FINDINGS: The heart size and mediastinal contours are within normal limits. Both lungs are clear. The visualized skeletal structures are unremarkable. IMPRESSION: No active cardiopulmonary disease. Electronically Signed   By: Elgie CollardArash  Radparvar M.D.   On: 07/17/2015 22:11   Ct Head Wo Contrast  07/18/2015  CLINICAL DATA:  Dizziness, headache, and slurred speech for 3 days. EXAM: CT HEAD WITHOUT CONTRAST TECHNIQUE: Contiguous axial images were obtained from the base of the skull through the vertex without intravenous contrast. COMPARISON:  MRI brain 05/05/2013 FINDINGS: Ventricles and sulci appear symmetrical. No ventricular dilatation. No mass effect or midline shift.  No abnormal extra-axial fluid collections. Gray-white matter junctions are distinct. Basal cisterns are not effaced. No evidence of acute intracranial hemorrhage. No depressed skull fractures. Visualized paranasal sinuses and mastoid air cells are not opacified. IMPRESSION: No acute intracranial abnormalities. Electronically Signed   By: Burman NievesWilliam  Stevens M.D.   On: 07/18/2015 00:46   Mr Maxine GlennMra Head Wo Contrast  07/18/2015  CLINICAL DATA:  Acute intractable headache, unspecified. Hypertension. EXAM: MRI HEAD WITHOUT CONTRAST MRA HEAD WITHOUT CONTRAST TECHNIQUE: Multiplanar, multiecho pulse sequences of the brain and surrounding structures were obtained without intravenous contrast. Angiographic images of the head were obtained using MRA technique without contrast. COMPARISON:  CT earlier today.  MRI brain 05/05/2013. FINDINGS: MRI HEAD FINDINGS No evidence for acute infarction, hemorrhage, mass lesion, hydrocephalus, or extra-axial fluid. Normal cerebral volume. Minor subcortical white matter disease, nonspecific, stable from 2015. Pituitary, pineal, and cerebellar tonsils unremarkable. No upper cervical lesions. Flow voids are maintained throughout the carotid, basilar, and vertebral arteries. There are no areas of chronic hemorrhage. Visualized calvarium, skull base, and upper cervical osseous structures unremarkable. Scalp and extracranial soft tissues, orbits, sinuses, and mastoids show no acute process. MRA HEAD FINDINGS Internal carotid arteries are widely patent. Basilar artery is widely patent with both vertebrals contributing, LEFT dominant. No intracranial stenosis or aneurysm. IMPRESSION: No acute intracranial abnormalities. Minor white matter disease, nonspecific. Negative MRA intracranial. Electronically Signed   By: Elsie StainJohn T Curnes M.D.   On: 07/18/2015 09:49   Mr Brain Wo Contrast  07/18/2015  CLINICAL DATA:  Acute intractable headache, unspecified. Hypertension. EXAM: MRI HEAD WITHOUT CONTRAST MRA  HEAD WITHOUT CONTRAST TECHNIQUE: Multiplanar, multiecho pulse sequences of the brain and surrounding structures were obtained without intravenous contrast. Angiographic images of the head were obtained using MRA technique without contrast. COMPARISON:  CT earlier today.  MRI brain 05/05/2013. FINDINGS: MRI HEAD FINDINGS No evidence for acute infarction, hemorrhage, mass lesion, hydrocephalus, or extra-axial fluid. Normal cerebral volume. Minor subcortical white matter disease, nonspecific, stable from 2015. Pituitary, pineal, and cerebellar tonsils unremarkable. No upper cervical lesions. Flow voids are maintained throughout the carotid, basilar, and vertebral arteries. There are no areas of chronic hemorrhage. Visualized calvarium, skull base, and upper cervical osseous structures unremarkable. Scalp and extracranial soft tissues, orbits, sinuses, and mastoids show no acute process. MRA HEAD FINDINGS Internal carotid arteries are widely patent. Basilar artery is widely patent with both vertebrals contributing, LEFT dominant. No intracranial stenosis or aneurysm. IMPRESSION: No acute intracranial abnormalities. Minor white matter disease, nonspecific. Negative MRA intracranial. Electronically Signed   By: Elsie StainJohn T Curnes M.D.   On: 07/18/2015 09:49   Dg Fluoro Guided Loc Of Needle/cath Tip For Spinal Inject Lt  07/18/2015  CLINICAL DATA:  New onset headache, blurred vision, chest pain EXAM: DIAGNOSTIC LUMBAR PUNCTURE UNDER FLUOROSCOPIC GUIDANCE FLUOROSCOPY TIME:  Fluoroscopy Time (in minutes and seconds): 0 minutes, 30 seconds Number of Acquired  Images:  1 PROCEDURE: Informed consent was obtained from the patient prior to the procedure, including potential complications of headache, allergy, and pain. With the patient prone, the lower back was prepped with Betadine. 1% Lidocaine was used for local anesthesia. Lumbar puncture was performed at the L3 level using a 22 gauge needle with return of clear CSF with an  opening pressure of 22 cm water. 10 ml of CSF were obtained for laboratory studies. The closing pressure was 15 cm of water. The patient tolerated the procedure well and there were no apparent complications. IMPRESSION: Successful fluoroscopically guided lumbar puncture with measuring of opening pressures and acquisition of spinal fluid for laboratory studies. The patient tolerated the procedure well and was returned to her room in stable condition. Electronically Signed   By: David  [place] M.D.   On: 07/18/2015 16:22     Assessment/Plan: 40 year old female presenting with headache.  Pain has been intractable.  MRI and MRA unremarkable.   Headache improved. S/p LP with opening pressure of 22 and closing 15. Headache worse with sitting up.  This is very unlikely BIH with opening pressure of 22.  Today headache is worse with sitting up which could be post LP headache.    - con't Ultram - Neurontin/baclofen are home meds for arthritis/muscle spasms - Will order 3g Mg now as well as Decadron  x 1.   - pregnancy test in case VPA needs to be ordered.  Pauletta Browns

## 2015-07-19 NOTE — Progress Notes (Signed)
Neurology:  Worsening headache this afternoon without any improvement with decadron or 3g of Mg.  Being given VPA 500mg  x1 as well as being started on Diamox 500 mg TID.   Danielle Tran, Danielle Tran

## 2015-07-19 NOTE — Progress Notes (Signed)
[facility] -  at [facility]   PATIENT NAME: Danielle Tran    MR#:  [id number]  DATE OF BIRTH:  [birth year]  She still has headache more with bending. no Other complaints.   CHIEF COMPLAINT:   Chief Complaint  Patient presents with  . Headache  . Chest Pain  . Dizziness    REVIEW OF SYSTEMS:    Review of Systems  Constitutional: Negative for fever and chills.  HENT: Negative for hearing loss.   Eyes: Negative for blurred vision, double vision and photophobia.  Respiratory: Negative for cough, hemoptysis and shortness of breath.   Cardiovascular: Negative for palpitations, orthopnea and leg swelling.  Gastrointestinal: Negative for vomiting, abdominal pain and diarrhea.  Genitourinary: Negative for dysuria and urgency.  Musculoskeletal: Negative for myalgias and neck pain.  Skin: Negative for rash.  Neurological: Positive for headaches. Negative for dizziness, focal weakness, seizures and weakness.  Psychiatric/Behavioral: Negative for memory loss. The patient does not have insomnia.     Nutrition: Tolerating Diet: Tolerating PT:      DRUG ALLERGIES:   Allergies  Allergen Reactions  . Trazodone Anaphylaxis    Other reaction(s): ANAPHYLAXIS  . Excedrin Extra Strength [Aspirin-Acetaminophen-Caffeine] Other (See Comments)    Hard time breathing  . Guaifenesin     Other reaction(s): VOMITING  . Hydrocodone     Other reaction(s): NAUSEA  . Maprotiline     Other reaction(s): ANAPHYLAXIS  . Morphine     Other reaction(s): VOMITING    VITALS:  Blood pressure 130/81, pulse 87, temperature 98.3 F (36.8 C), temperature source Oral, resp. rate 16, height  (1.626 m), weight 99.837 kg (220 lb 1.6 oz), SpO2 97 %.  PHYSICAL EXAMINATION:   Physical Exam  GENERAL:  40 y.o.-year-old patient lying in the bed with no acute distress.  EYES: Pupils equal, round, reactive to light and accommodation. No scleral icterus. Extraocular muscles  intact.  HEENT: Head atraumatic, normocephalic. Oropharynx and nasopharynx clear.  NECK:  Supple, no jugular venous distention. No thyroid enlargement, no tenderness.  LUNGS: Normal breath sounds bilaterally, no wheezing, rales,rhonchi or crepitation. No use of accessory muscles of respiration.  CARDIOVASCULAR: S1, S2 normal. No murmurs, rubs, or gallops.  ABDOMEN: Soft, nontender, nondistended. Bowel sounds present. No organomegaly or mass.  EXTREMITIES: No pedal edema, cyanosis, or clubbing.  NEUROLOGIC: Cranial nerves II through XII are intact. Muscle strength 5/5 in all extremities. Sensation intact. Gait not checked.  PSYCHIATRIC: The patient is alert and oriented x 3.  SKIN: No obvious rash, lesion, or ulcer.    LABORATORY PANEL:   CBC  Recent Labs Lab 07/17/15 2149  WBC 9.3  HGB 13.2  HCT 40.1  PLT 282   ------------------------------------------------------------------------------------------------------------------  Chemistries   Recent Labs Lab 07/17/15 2149  NA 137  K 3.7  CL 106  CO2 24  GLUCOSE 135*  BUN 13  CREATININE 0.69  CALCIUM 9.0   ------------------------------------------------------------------------------------------------------------------  Cardiac Enzymes  Recent Labs Lab 07/18/15 1820  TROPONINI <0.03   ------------------------------------------------------------------------------------------------------------------  RADIOLOGY:  Dg Chest 2 View  07/17/2015  CLINICAL DATA:  40 year old female with chest pain EXAM: CHEST  2 VIEW COMPARISON:  Chest CT dated 09/04/2013 FINDINGS: The heart size and mediastinal contours are within normal limits. Both lungs are clear. The visualized skeletal structures are unremarkable. IMPRESSION: No active cardiopulmonary disease. Electronically Signed   By: Elgie Collard M.D.   On: 07/17/2015 22:11   Ct Head Wo Contrast  07/18/2015  CLINICAL DATA:  Dizziness, headache, and slurred speech for 3 days.  EXAM: CT HEAD WITHOUT CONTRAST TECHNIQUE: Contiguous axial images were obtained from the base of the skull through the vertex without intravenous contrast. COMPARISON:  MRI brain 05/05/2013 FINDINGS: Ventricles and sulci appear symmetrical. No ventricular dilatation. No mass effect or midline shift. No abnormal extra-axial fluid collections. Gray-white matter junctions are distinct. Basal cisterns are not effaced. No evidence of acute intracranial hemorrhage. No depressed skull fractures. Visualized paranasal sinuses and mastoid air cells are not opacified. IMPRESSION: No acute intracranial abnormalities. Electronically Signed   By: Burman Nieves M.D.   On: 07/18/2015 00:46   Mr Maxine Glenn Head Wo Contrast  07/18/2015  CLINICAL DATA:  Acute intractable headache, unspecified. Hypertension. EXAM: MRI HEAD WITHOUT CONTRAST MRA HEAD WITHOUT CONTRAST TECHNIQUE: Multiplanar, multiecho pulse sequences of the brain and surrounding structures were obtained without intravenous contrast. Angiographic images of the head were obtained using MRA technique without contrast. COMPARISON:  CT earlier today.  MRI brain 05/05/2013. FINDINGS: MRI HEAD FINDINGS No evidence for acute infarction, hemorrhage, mass lesion, hydrocephalus, or extra-axial fluid. Normal cerebral volume. Minor subcortical white matter disease, nonspecific, stable from 2015. Pituitary, pineal, and cerebellar tonsils unremarkable. No upper cervical lesions. Flow voids are maintained throughout the carotid, basilar, and vertebral arteries. There are no areas of chronic hemorrhage. Visualized calvarium, skull base, and upper cervical osseous structures unremarkable. Scalp and extracranial soft tissues, orbits, sinuses, and mastoids show no acute process. MRA HEAD FINDINGS Internal carotid arteries are widely patent. Basilar artery is widely patent with both vertebrals contributing, LEFT dominant. No intracranial stenosis or aneurysm. IMPRESSION: No acute intracranial  abnormalities. Minor white matter disease, nonspecific. Negative MRA intracranial. Electronically Signed   By: Elsie Stain M.D.   On: 07/18/2015 09:49   Mr Brain Wo Contrast  07/18/2015  CLINICAL DATA:  Acute intractable headache, unspecified. Hypertension. EXAM: MRI HEAD WITHOUT CONTRAST MRA HEAD WITHOUT CONTRAST TECHNIQUE: Multiplanar, multiecho pulse sequences of the brain and surrounding structures were obtained without intravenous contrast. Angiographic images of the head were obtained using MRA technique without contrast. COMPARISON:  CT earlier today.  MRI brain 05/05/2013. FINDINGS: MRI HEAD FINDINGS No evidence for acute infarction, hemorrhage, mass lesion, hydrocephalus, or extra-axial fluid. Normal cerebral volume. Minor subcortical white matter disease, nonspecific, stable from 2015. Pituitary, pineal, and cerebellar tonsils unremarkable. No upper cervical lesions. Flow voids are maintained throughout the carotid, basilar, and vertebral arteries. There are no areas of chronic hemorrhage. Visualized calvarium, skull base, and upper cervical osseous structures unremarkable. Scalp and extracranial soft tissues, orbits, sinuses, and mastoids show no acute process. MRA HEAD FINDINGS Internal carotid arteries are widely patent. Basilar artery is widely patent with both vertebrals contributing, LEFT dominant. No intracranial stenosis or aneurysm. IMPRESSION: No acute intracranial abnormalities. Minor white matter disease, nonspecific. Negative MRA intracranial. Electronically Signed   By: Elsie Stain M.D.   On: 07/18/2015 09:49   Dg Fluoro Guided Loc Of Needle/cath Tip For Spinal Inject Lt  07/18/2015  CLINICAL DATA:  New onset headache, blurred vision, chest pain EXAM: DIAGNOSTIC LUMBAR PUNCTURE UNDER FLUOROSCOPIC GUIDANCE FLUOROSCOPY TIME:  Fluoroscopy Time (in minutes and seconds): 0 minutes, 30 seconds Number of Acquired Images:  1 PROCEDURE: Informed consent was obtained from the patient prior to  the procedure, including potential complications of headache, allergy, and pain. With the patient prone, the lower back was prepped with Betadine. 1% Lidocaine was used for local anesthesia. Lumbar puncture was performed at the L3 level using a 22 gauge needle  with return of clear CSF with an opening pressure of 22 cm water. 10 ml of CSF were obtained for laboratory studies. The closing pressure was 15 cm of water. The patient tolerated the procedure well and there were no apparent complications. IMPRESSION: Successful fluoroscopically guided lumbar puncture with measuring of opening pressures and acquisition of spinal fluid for laboratory studies. The patient tolerated the procedure well and was returned to her room in stable condition. Electronically Signed   By: David  [place]azilandJordan M.D.   On: 07/18/2015 16:22     ASSESSMENT AND PLAN:   Active Problems:   Intractable headache  1.intractable headache.MRIbrain,MRA brain are negative.Lp  Did not show elevated open pressure . Seen by neurology, started on IV Decadron, the magnesium. Patient pregnancy test is negative. I did discuss with Dr. Nona Dellzelikman , If the patient headache improves he can go home otherwise we are going to try her on valproic acid.    All the records are reviewed and case discussed with Care Management/Social Workerr. Management plans discussed with the patient, family and they are in agreement.  CODE STATUS:full  TOTAL TIME TAKING CARE OF THIS PATIENT: 20 minutes.   POSSIBLE D/C IN 1-2DAYS, DEPENDING ON CLINICAL CONDITION.   Katha HammingKONIDENA,Nichol Ator M.D on 07/19/2015 at 1:22 PM  Between 7am to 6pm - Pager - [phone]  After 6pm go to [email or web address] - password [email or web address] [facility]  [facility]  Office  [phone]  CC: Primary care physician; Leotis ShamesSingh,Jasmine, MD

## 2015-07-19 NOTE — Progress Notes (Signed)
   07/19/15 1020  Clinical Encounter Type  Visited With Patient  Visit Type Initial  Consult/Referral To Chaplain  Spiritual Encounters  Spiritual Needs Emotional  Stress Factors  Patient Stress Factors Exhausted  Visited patient during rounding. 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 Discussed options for managing physical & emotional self-care. Chap. Zaynah Chawla G. Alvie Fowles, ext. [phone]

## 2015-07-20 DIAGNOSIS — R51 Headache: Secondary | ICD-10-CM | POA: Diagnosis not present

## 2015-07-20 MED ORDER — METAXALONE 800 MG PO TABS
800.0000 mg | ORAL_TABLET | Freq: Three times a day (TID) | ORAL | Status: DC
  Administered 2015-07-20 (×3): 800 mg via ORAL
  Filled 2015-07-20 (×7): qty 1

## 2015-07-20 NOTE — Consult Note (Signed)
Reason for Consult:Headache Referring Physician: Luberta Mutter  CC: Headache  States medication has limited affect. Still complaining diffuse pressure like temporal headache.    Past Medical History  Diagnosis Date  . Hypertension     Past Surgical History  Procedure Laterality Date  . Back surgery      Family History  Problem Relation Age of Onset  . Diabetes Mellitus II Father   . Hypertension Father     Social History:  reports that she has never smoked. She does not have any smokeless tobacco history on file. She reports that she drinks alcohol. Her drug history is not on file.  Allergies  Allergen Reactions  . Trazodone Anaphylaxis    Other reaction(s): ANAPHYLAXIS  . Excedrin Extra Strength [Aspirin-Acetaminophen-Caffeine] Other (See Comments)    Hard time breathing  . Guaifenesin     Other reaction(s): VOMITING  . Hydrocodone     Other reaction(s): NAUSEA  . Maprotiline     Other reaction(s): ANAPHYLAXIS  . Morphine     Other reaction(s): VOMITING    Medications:  I have reviewed the patient's current medications. Prior to Admission:  Prescriptions prior to admission  Medication Sig Dispense Refill Last Dose  . baclofen (LIORESAL) 10 MG tablet Take 1 tablet by mouth 3 (three) times daily as needed.  1 PRN at PRN  . Cholecalciferol (VITAMIN D-1000 MAX ST) 1000 UNITS tablet Take 1,000 Units by mouth daily.    07/17/2015 at Unknown time  . gabapentin (NEURONTIN) 300 MG capsule Take 300 mg by mouth 2 (two) times daily.    07/17/2015 at Unknown time  . ibuprofen (ADVIL,MOTRIN) 200 MG tablet Take 200 mg by mouth every 6 (six) hours as needed.   PRN at PRN  . lisinopril (PRINIVIL,ZESTRIL) 10 MG tablet Take 10 mg by mouth daily.   07/17/2015 at Unknown time  . oxyCODONE-acetaminophen (PERCOCET/ROXICET) 5-325 MG per tablet Take 1 tablet by mouth every 12 (twelve) hours.    07/17/2015 at Unknown time   Scheduled: . acetaZOLAMIDE  500 mg Oral TID  . cholecalciferol  1,000  Units Oral Daily  . docusate sodium  100 mg Oral BID  . gabapentin  300 mg Oral BID  . lisinopril  10 mg Oral Daily  . metaxalone  800 mg Oral TID    ROS: History obtained from the patient  General ROS: negative for - chills, fatigue, fever, night sweats, weight gain or weight loss Psychological ROS: negative for - behavioral disorder, hallucinations, memory difficulties, mood swings or suicidal ideation Ophthalmic ROS: as noted in HPI ENT ROS: negative for - epistaxis, nasal discharge, oral lesions, sore throat, tinnitus or vertigo Allergy and Immunology ROS: negative for - hives or itchy/watery eyes Hematological and Lymphatic ROS: negative for - bleeding problems, bruising or swollen lymph nodes Endocrine ROS: negative for - galactorrhea, hair pattern changes, polydipsia/polyuria or temperature intolerance Respiratory ROS: negative for - cough, hemoptysis, shortness of breath or wheezing Cardiovascular ROS: negative for - chest pain, dyspnea on exertion, edema or irregular heartbeat Gastrointestinal ROS: as noted in HPI Genito-Urinary ROS: negative for - dysuria, hematuria, incontinence or urinary frequency/urgency Musculoskeletal ROS: negative for - joint swelling or muscular weakness Neurological ROS: as noted in HPI Dermatological ROS: negative for rash and skin lesion changes  Physical Examination: Blood pressure 123/71, pulse 88, temperature 97.7 F (36.5 C), temperature source Oral, resp. rate 18, height  (1.626 m), weight 216 lb (97.977 kg), SpO2 99 %.  HEENT-  Normocephalic, no lesions, without obvious  abnormality.  Normal external eye and conjunctiva.  Normal TM's bilaterally.  Normal auditory canals and external ears. Normal external nose, mucus membranes and septum.  Normal pharynx. Cardiovascular- S1, S2 normal, pulses palpable throughout   Lungs- chest clear, no wheezing, rales, normal symmetric air entry Abdomen- soft, non-tender; bowel sounds normal; no masses,   no organomegaly Extremities- no edema Lymph-no adenopathy palpable Musculoskeletal-no joint tenderness, deformity or swelling Skin-warm and dry, no hyperpigmentation, vitiligo, or suspicious lesions  Neurological Examination Mental Status: Alert, oriented, thought content appropriate.  Speech fluent without evidence of aphasia.  Able to follow 3 step commands without difficulty. Cranial Nerves: II: Discs difficult to visualize; Visual fields grossly normal, pupils equal, round, reactive to light and accommodation III,IV, VI: ptosis not present, extra-ocular motions intact bilaterally V,VII: decreased left NLF, facial light touch sensation normal bilaterally VIII: hearing normal bilaterally IX,X: gag reflex present XI: bilateral shoulder shrug XII: midline tongue extension Motor: Right : Upper extremity   5/5    Left:     Upper extremity   5/5  Lower extremity   5/5     Lower extremity   5/5 Tone and bulk:normal tone throughout; no atrophy noted Sensory: Pinprick and light touch intact throughout, bilaterally Deep Tendon Reflexes: 2+ and symmetric throughout Plantars: Right: downgoing   Left: downgoing Cerebellar: Normal finger-to-nose and normal heel-to-shin testing bilaterally Gait: not tested due to severity of pain.      Laboratory Studies:   Basic Metabolic Panel:  Recent Labs Lab 07/17/15 2149  NA 137  K 3.7  CL 106  CO2 24  GLUCOSE 135*  BUN 13  CREATININE 0.69  CALCIUM 9.0    Liver Function Tests: No results for input(s): AST, ALT, ALKPHOS, BILITOT, PROT, ALBUMIN in the last 168 hours. No results for input(s): LIPASE, AMYLASE in the last 168 hours. No results for input(s): AMMONIA in the last 168 hours.  CBC:  Recent Labs Lab 07/17/15 2149  WBC 9.3  HGB 13.2  HCT 40.1  MCV 86.1  PLT 282    Cardiac Enzymes:  Recent Labs Lab 07/17/15 2149 07/18/15 0759 07/18/15 1235 07/18/15 1820  TROPONINI <0.03 <0.03 <0.03 <0.03    BNP: Invalid  input(s): POCBNP  CBG: No results for input(s): GLUCAP in the last 168 hours.  Microbiology: Results for orders placed or performed during the hospital encounter of 07/18/15  CSF culture     Status: None (Preliminary result)   Collection Time: 07/18/15  2:27 PM  Result Value Ref Range Status   Specimen Description CSF  Final   Special Requests Normal  Final   Gram Stain NO ORGANISMS SEEN  Final   Culture NO GROWTH 2 DAYS  Final   Report Status PENDING  Incomplete    Coagulation Studies:  Recent Labs  07/18/15 1235  LABPROT 13.8  INR 1.04    Urinalysis: No results for input(s): COLORURINE, LABSPEC, PHURINE, GLUCOSEU, HGBUR, BILIRUBINUR, KETONESUR, PROTEINUR, UROBILINOGEN, NITRITE, LEUKOCYTESUR in the last 168 hours.  Invalid input(s): APPERANCEUR  Lipid Panel:  No results found for: CHOL, TRIG, HDL, CHOLHDL, VLDL, LDLCALC  HgbA1C:  Lab Results  Component Value Date   HGBA1C 6.2* 07/18/2015    Urine Drug Screen:  No results found for: LABOPIA, COCAINSCRNUR, LABBENZ, AMPHETMU, THCU, LABBARB  Alcohol Level: No results for input(s): ETH in the last 168 hours.  Other results: EKG: normal sinus rhythm at 88 bpm.  Imaging: Dg Fluoro Guided Loc Of Needle/cath Tip For Spinal Inject Lt  07/18/2015  CLINICAL  DATA:  New onset headache, blurred vision, chest pain EXAM: DIAGNOSTIC LUMBAR PUNCTURE UNDER FLUOROSCOPIC GUIDANCE FLUOROSCOPY TIME:  Fluoroscopy Time (in minutes and seconds): 0 minutes, 30 seconds Number of Acquired Images:  1 PROCEDURE: Informed consent was obtained from the patient prior to the procedure, including potential complications of headache, allergy, and pain. With the patient prone, the lower back was prepped with Betadine. 1% Lidocaine was used for local anesthesia. Lumbar puncture was performed at the L3 level using a 22 gauge needle with return of clear CSF with an opening pressure of 22 cm water. 10 ml of CSF were obtained for laboratory studies. The closing  pressure was 15 cm of water. The patient tolerated the procedure well and there were no apparent complications. IMPRESSION: Successful fluoroscopically guided lumbar puncture with measuring of opening pressures and acquisition of spinal fluid for laboratory studies. The patient tolerated the procedure well and was returned to her room in stable condition. Electronically Signed   By: David  SwazilandJordan M.D.   On: 07/18/2015 16:22     Assessment/Plan: 40 year old female presenting with headache.  Pain has been intractable.  MRI and MRA unremarkable.   Headache improved. S/p LP with opening pressure of 22 and closing 15. Headache worse with sitting up.    Started on Diamox yesterday of 500 TID. Received steroids, Mg, and VPA.   - Obtain MRV today to look for sinus thrombosis. - if MRV no signs of thrombosis then plan d/c  - d/w pt and primary team.   Pauletta BrownsZEYLIKMAN, Deicy Rusk

## 2015-07-20 NOTE — Progress Notes (Signed)
[facility] - [place] at [facility]   PATIENT NAME: Danielle Tran    MR#:  [id number]  DATE OF BIRTH:  [birth year]  Till complains of headache. Patient complains of muscle pain unable to open this up completely. And the she is requesting stronger pain medicines. And also requesting FMLA papers.   CHIEF COMPLAINT:   Chief Complaint  Patient presents with  . Headache  . Chest Pain  . Dizziness    REVIEW OF SYSTEMS:    Review of Systems  Constitutional: Negative for fever and chills.  HENT: Negative for hearing loss.   Eyes: Negative for blurred vision, double vision and photophobia.  Respiratory: Negative for cough, hemoptysis and shortness of breath.   Cardiovascular: Negative for palpitations, orthopnea and leg swelling.  Gastrointestinal: Negative for vomiting, abdominal pain and diarrhea.  Genitourinary: Negative for dysuria and urgency.  Musculoskeletal: Negative for myalgias and neck pain.  Skin: Negative for rash.  Neurological: Positive for headaches. Negative for dizziness, focal weakness, seizures and weakness.  Psychiatric/Behavioral: Negative for memory loss. The patient does not have insomnia.     Nutrition: Tolerating Diet: Tolerating PT:      DRUG ALLERGIES:   Allergies  Allergen Reactions  . Trazodone Anaphylaxis    Other reaction(s): ANAPHYLAXIS  . Excedrin Extra Strength [Aspirin-Acetaminophen-Caffeine] Other (See Comments)    Hard time breathing  . Guaifenesin     Other reaction(s): VOMITING  . Hydrocodone     Other reaction(s): NAUSEA  . Maprotiline     Other reaction(s): ANAPHYLAXIS  . Morphine     Other reaction(s): VOMITING    VITALS:  Blood pressure 123/71, pulse 88, temperature 97.7 F (36.5 C), temperature source Oral, resp. rate 18, height  (1.626 m), weight 97.977 kg (216 lb), SpO2 99 %.  PHYSICAL EXAMINATION:   Physical Exam  GENERAL:  40 y.o.-year-old patient lying in the bed with no acute  distress.  EYES: Pupils equal, round, reactive to light and accommodation. No scleral icterus. Extraocular muscles intact.  HEENT: Head atraumatic, normocephalic. Oropharynx and nasopharynx clear.  NECK:  Supple, no jugular venous distention. No thyroid enlargement, no tenderness.  LUNGS: Normal breath sounds bilaterally, no wheezing, rales,rhonchi or crepitation. No use of accessory muscles of respiration.  CARDIOVASCULAR: S1, S2 normal. No murmurs, rubs, or gallops.  ABDOMEN: Soft, nontender, nondistended. Bowel sounds present. No organomegaly or mass.  EXTREMITIES: No pedal edema, cyanosis, or clubbing.  NEUROLOGIC: Cranial nerves II through XII are intact. Muscle strength 5/5 in all extremities. Sensation intact. Gait not checked.  PSYCHIATRIC: The patient is alert and oriented x 3.  SKIN: No obvious rash, lesion, or ulcer.    LABORATORY PANEL:   CBC  Recent Labs Lab 07/17/15 2149  WBC 9.3  HGB 13.2  HCT 40.1  PLT 282   ------------------------------------------------------------------------------------------------------------------  Chemistries   Recent Labs Lab 07/17/15 2149  NA 137  K 3.7  CL 106  CO2 24  GLUCOSE 135*  BUN 13  CREATININE 0.69  CALCIUM 9.0   ------------------------------------------------------------------------------------------------------------------  Cardiac Enzymes  Recent Labs Lab 07/18/15 1820  TROPONINI <0.03   ------------------------------------------------------------------------------------------------------------------  RADIOLOGY:  Dg Fluoro Guided Loc Of Needle/cath Tip For Spinal Inject Lt  07/18/2015  CLINICAL DATA:  New onset headache, blurred vision, chest pain EXAM: DIAGNOSTIC LUMBAR PUNCTURE UNDER FLUOROSCOPIC GUIDANCE FLUOROSCOPY TIME:  Fluoroscopy Time (in minutes and seconds): 0 minutes, 30 seconds Number of Acquired Images:  1 PROCEDURE: Informed consent was obtained from the patient prior to the procedure,  including potential complications of headache, allergy, and pain. With the patient prone, the lower back was prepped with Betadine. 1% Lidocaine was used for local anesthesia. Lumbar puncture was performed at the L3 level using a 22 gauge needle with return of clear CSF with an opening pressure of 22 cm water. 10 ml of CSF were obtained for laboratory studies. The closing pressure was 15 cm of water. The patient tolerated the procedure well and there were no apparent complications. IMPRESSION: Successful fluoroscopically guided lumbar puncture with measuring of opening pressures and acquisition of spinal fluid for laboratory studies. The patient tolerated the procedure well and was returned to her room in stable condition. Electronically Signed   By: David  [place]ilandJordan M.D.   On: 07/18/2015 16:22     ASSESSMENT AND PLAN:   Active Problems:   Intractable headache  1.Intractable headache.MRIbrain,MRA brain are negative.Lp  Did not show elevated open pressure . Seen by neurology, started on IV Decadron, the magnesium. Patient pregnancy test is negative. I did discuss with Dr. Nona Dellzelikman , MRV TODAY.CONTINUE DEPAKOTE,DIAMOX.POSSIBLE NARCOTIC SEEKING BEHAVIOR.ASKING FMLA PAPER. If  MR V is negative.pt can be discharged  Home.    All the records are reviewed and case discussed with Care Management/Social Workerr. Management plans discussed with the patient, family and they are in agreement.  CODE STATUS:full  TOTAL TIME TAKING CARE OF THIS PATIENT: 20 minutes.   POSSIBLE D/C IN 1-2DAYS, DEPENDING ON CLINICAL CONDITION.   Katha HammingKONIDENA,Lannis Lichtenwalner M.D on 07/20/2015 at 10:50 AM  Between 7am to 6pm - Pager - [phone]  After 6pm go to [email or web address] - password [email or web address]  [facility]  Office  [phone]  CC: Primary care physician; Leotis ShamesSingh,Jasmine, MD

## 2015-07-21 ENCOUNTER — Observation Stay: Payer: BLUE CROSS/BLUE SHIELD

## 2015-07-21 MED ORDER — ACETAZOLAMIDE 250 MG PO TABS: 500.0000 mg | ORAL_TABLET | Freq: Three times a day (TID) | ORAL | Status: AC

## 2015-07-21 MED ORDER — TRAMADOL HCL 50 MG PO TABS: 50.0000 mg | ORAL_TABLET | Freq: Two times a day (BID) | ORAL | Status: AC

## 2015-07-21 NOTE — Progress Notes (Signed)
Discharge instructions given to patient, with prescription for Ultram-saline lock discontinued-waiting on ride.

## 2015-07-21 NOTE — Discharge Summary (Signed)
Danielle Tran, is a 40 y.o. female  DOB [birth year]  MRN [id number].  Admission date:  07/18/2015  Admitting Physician  Arnaldo NatalMichael S Diamond, MD  Discharge Date:  07/21/2015   Primary MD  Leotis ShamesSingh,Jasmine, MD  Recommendations for primary care physician for things to follow:  Primary doctor in 2-3 days . Patient needs appointment with neurology, Dr. Malvin JohnsPotter for follow-up on headaches.  Admission Diagnosis  Acute nonintractable headache, unspecified headache type [R51]   Discharge Diagnosis  Acute nonintractable headache, unspecified headache type [R51]    Active Problems:   Intractable headache      Past Medical History  Diagnosis Date  . Hypertension     Past Surgical History  Procedure Laterality Date  . Back surgery         History of present illness and  Hospital Course:     Kindly see H&P for history of present illness and admission details, please review complete Labs, Consult reports and Test reports for all details in brief  HPI  from the history and physical done on the day of admission  40 year old arm female patient with history of migraine headaches came in because of severe headache which is different than her migraines, she described the headache as bandlike tightness around the front of her head squeezing tightly and then relaxes.   Hospital Course  : Headache intractable: Etiology on admission thought to be admitted due to opiate to rebound headache but patient had lot of tests done including MRI of the brain, MRA of the brain, lumbar puncture, magnetic resonance venogram today for the brain. Patient received multiple different types of medications including Imitrex, magnesium, Decadron, tramadol,. Patient is seen by neurology Dr. Nona DellZelikman.when and Dr. Thad Rangereynolds . Patient did not have any neurological  deficit but the headache persisted. Her LP showed opening pressure for CSF is only 22 mmHg. Lumbar puncture data showed that CSF cultures are negative she has no infection in the brain. Patient received IV hydration also. Because of concern for headache and possible pseudotumor cerebri patient was given acetazolamide 500 mg 3 times a day, and patient will be discharged home on that medicine till she sees the primary doctor, neurologist Dr. Malvin JohnsPotter. We will the make sure she follows up with Dr. Malvin JohnsPotter of neurology. Neurological exam is completely benign.  Discharge Condition: stable   Follow UP  Follow-up Information    Follow up with Singh,Jasmine, MD In 3 days.   Specialty:  Internal Medicine   Contact information:   [address] [facility] [place] [address] [phone]       Follow up with Alphonzo LemmingsPotter, Zachary Eric, MD In 1 week.   Specialty:  Neurology   Why:  first avalable,new pt ,follow up for headache,possible pseudotumor cerebri as per neruology in hospital   Contact information:   [address] [facility] [place] [address] [phone]         Discharge Instructions  and  Discharge Medications        Medication List    TAKE these medications        acetaZOLAMIDE 250 MG tablet  Commonly known as:  DIAMOX  Take 2 tablets (500 mg total) by mouth 3 (three) times daily.     baclofen 10 MG tablet  Commonly known as:  LIORESAL  Take 1 tablet by mouth 3 (three) times daily as needed.     gabapentin 300 MG capsule  Commonly known as:  NEURONTIN  Take  300 mg by mouth 2 (two) times daily.     ibuprofen 200 MG tablet  Commonly known as:  ADVIL,MOTRIN  Take 200 mg by mouth every 6 (six) hours as needed.     lisinopril 10 MG tablet  Commonly known as:  PRINIVIL,ZESTRIL  Take 10 mg by mouth daily.     oxyCODONE-acetaminophen 5-325 MG tablet  Commonly known as:  PERCOCET/ROXICET  Take 1 tablet by mouth every 12  (twelve) hours.     traMADol 50 MG tablet  Commonly known as:  ULTRAM  Take 1 tablet (50 mg total) by mouth 2 (two) times daily.     VITAMIN D-1000 MAX ST 1000 units tablet  Generic drug:  Cholecalciferol  Take 1,000 Units by mouth daily.          Diet and Activity recommendation: See Discharge Instructions above   Consults obtained - Neurology   Major procedures and Radiology Reports - PLEASE review detailed and final reports for all details, in brief -     Dg Chest 2 View  07/17/2015  CLINICAL DATA:  40 year old female with chest pain EXAM: CHEST  2 VIEW COMPARISON:  Chest CT dated 09/04/2013 FINDINGS: The heart size and mediastinal contours are within normal limits. Both lungs are clear. The visualized skeletal structures are unremarkable. IMPRESSION: No active cardiopulmonary disease. Electronically Signed   By: Elgie Collard M.D.   On: 07/17/2015 22:11   Ct Head Wo Contrast  07/18/2015  CLINICAL DATA:  Dizziness, headache, and slurred speech for 3 days. EXAM: CT HEAD WITHOUT CONTRAST TECHNIQUE: Contiguous axial images were obtained from the base of the skull through the vertex without intravenous contrast. COMPARISON:  MRI brain 05/05/2013 FINDINGS: Ventricles and sulci appear symmetrical. No ventricular dilatation. No mass effect or midline shift. No abnormal extra-axial fluid collections. Gray-white matter junctions are distinct. Basal cisterns are not effaced. No evidence of acute intracranial hemorrhage. No depressed skull fractures. Visualized paranasal sinuses and mastoid air cells are not opacified. IMPRESSION: No acute intracranial abnormalities. Electronically Signed   By: Burman Nieves M.D.   On: 07/18/2015 00:46   Mr Maxine Glenn Head Wo Contrast  07/18/2015  CLINICAL DATA:  Acute intractable headache, unspecified. Hypertension. EXAM: MRI HEAD WITHOUT CONTRAST MRA HEAD WITHOUT CONTRAST TECHNIQUE: Multiplanar, multiecho pulse sequences of the brain and surrounding  structures were obtained without intravenous contrast. Angiographic images of the head were obtained using MRA technique without contrast. COMPARISON:  CT earlier today.  MRI brain 05/05/2013. FINDINGS: MRI HEAD FINDINGS No evidence for acute infarction, hemorrhage, mass lesion, hydrocephalus, or extra-axial fluid. Normal cerebral volume. Minor subcortical white matter disease, nonspecific, stable from 2015. Pituitary, pineal, and cerebellar tonsils unremarkable. No upper cervical lesions. Flow voids are maintained throughout the carotid, basilar, and vertebral arteries. There are no areas of chronic hemorrhage. Visualized calvarium, skull base, and upper cervical osseous structures unremarkable. Scalp and extracranial soft tissues, orbits, sinuses, and mastoids show no acute process. MRA HEAD FINDINGS Internal carotid arteries are widely patent. Basilar artery is widely patent with both vertebrals contributing, LEFT dominant. No intracranial stenosis or aneurysm. IMPRESSION: No acute intracranial abnormalities. Minor white matter disease, nonspecific. Negative MRA intracranial. Electronically Signed   By: Elsie Stain M.D.   On: 07/18/2015 09:49   Mr Brain Wo Contrast  07/18/2015  CLINICAL DATA:  Acute intractable headache, unspecified. Hypertension. EXAM: MRI HEAD WITHOUT CONTRAST MRA HEAD WITHOUT CONTRAST TECHNIQUE: Multiplanar, multiecho pulse sequences of the brain and surrounding structures were obtained without intravenous contrast. Angiographic images  of the head were obtained using MRA technique without contrast. COMPARISON:  CT earlier today.  MRI brain 05/05/2013. FINDINGS: MRI HEAD FINDINGS No evidence for acute infarction, hemorrhage, mass lesion, hydrocephalus, or extra-axial fluid. Normal cerebral volume. Minor subcortical white matter disease, nonspecific, stable from 2015. Pituitary, pineal, and cerebellar tonsils unremarkable. No upper cervical lesions. Flow voids are maintained throughout the  carotid, basilar, and vertebral arteries. There are no areas of chronic hemorrhage. Visualized calvarium, skull base, and upper cervical osseous structures unremarkable. Scalp and extracranial soft tissues, orbits, sinuses, and mastoids show no acute process. MRA HEAD FINDINGS Internal carotid arteries are widely patent. Basilar artery is widely patent with both vertebrals contributing, LEFT dominant. No intracranial stenosis or aneurysm. IMPRESSION: No acute intracranial abnormalities. Minor white matter disease, nonspecific. Negative MRA intracranial. Electronically Signed   By: Elsie Stain M.D.   On: 07/18/2015 09:49   Dg Fluoro Guided Loc Of Needle/cath Tip For Spinal Inject Lt  07/18/2015  CLINICAL DATA:  New onset headache, blurred vision, chest pain EXAM: DIAGNOSTIC LUMBAR PUNCTURE UNDER FLUOROSCOPIC GUIDANCE FLUOROSCOPY TIME:  Fluoroscopy Time (in minutes and seconds): 0 minutes, 30 seconds Number of Acquired Images:  1 PROCEDURE: Informed consent was obtained from the patient prior to the procedure, including potential complications of headache, allergy, and pain. With the patient prone, the lower back was prepped with Betadine. 1% Lidocaine was used for local anesthesia. Lumbar puncture was performed at the L3 level using a 22 gauge needle with return of clear CSF with an opening pressure of 22 cm water. 10 ml of CSF were obtained for laboratory studies. The closing pressure was 15 cm of water. The patient tolerated the procedure well and there were no apparent complications. IMPRESSION: Successful fluoroscopically guided lumbar puncture with measuring of opening pressures and acquisition of spinal fluid for laboratory studies. The patient tolerated the procedure well and was returned to her room in stable condition. Electronically Signed   By: David  [place] M.D.   On: 07/18/2015 16:22    Micro Results     Recent Results (from the past 240 hour(s))  CSF culture     Status: None (Preliminary  result)   Collection Time: 07/18/15  2:27 PM  Result Value Ref Range Status   Specimen Description CSF  Final   Special Requests Normal  Final   Gram Stain NO ORGANISMS SEEN  Final   Culture NO GROWTH 3 DAYS  Final   Report Status PENDING  Incomplete       Today   Subjective:   Leira Regino today Has some headache but I told her that if MRV of the brain is negative she should be able to go home. Discussed the same with Dr. Leretha Pol who agrees that she can be discharged after this test. Objective:   Blood pressure 100/63, pulse 74, temperature 97.6 F (36.4 C), temperature source Oral, resp. rate 24, height  (1.626 m), weight 97.614 kg (215 lb 3.2 oz), SpO2 99 %.   Intake/Output Summary (Last 24 hours) at 07/21/15 1018 Last data filed at 07/21/15 0900  Gross per 24 hour  Intake    720 ml  Output      0 ml  Net    720 ml    Exam Awake Alert, Oriented x 3, No new F.N deficits, Normal affect [place].AT,PERRAL Supple Neck,No JVD, No cervical lymphadenopathy appriciated.  Symmetrical Chest wall movement, Good air movement bilaterally, CTAB RRR,No Gallops,Rubs or new Murmurs, No Parasternal Heave +ve B.Sounds, Abd  Soft, Non tender, No organomegaly appriciated, No rebound -guarding or rigidity. No Cyanosis, Clubbing or edema, No new Rash or bruise  Data Review   CBC w Diff: Lab Results  Component Value Date   WBC 9.3 07/17/2015   WBC 10.5 02/25/2014   HGB 13.2 07/17/2015   HGB 13.9 02/25/2014   HCT 40.1 07/17/2015   HCT 42.5 02/25/2014   PLT 282 07/17/2015   PLT 319 02/25/2014   LYMPHOPCT 21.2 02/25/2014   MONOPCT 5.0 02/25/2014   EOSPCT 3.2 02/25/2014   BASOPCT 0.3 02/25/2014    CMP: Lab Results  Component Value Date   NA 137 07/17/2015   NA 139 02/25/2014   K 3.7 07/17/2015   K 4.0 02/25/2014   CL 106 07/17/2015   CL 104 02/25/2014   CO2 24 07/17/2015   CO2 30 02/25/2014   BUN 13 07/17/2015   BUN 10 02/25/2014   CREATININE 0.69 07/17/2015    CREATININE 0.70 02/25/2014   PROT 7.8 02/25/2014   ALBUMIN 3.8 02/25/2014   BILITOT 0.2 02/25/2014   ALKPHOS 65 02/25/2014   AST 33 02/25/2014   ALT 37 02/25/2014  .   Total Time in preparing paper work, data evaluation and todays exam - 35 minutes  Sarrah Fiorenza M.D on 07/21/2015 at 10:18 AM    Note: This dictation was prepared with Dragon dictation along with smaller phrase technology. Any transcriptional errors that result from this process are unintentional.

## 2015-07-21 NOTE — Discharge Instructions (Signed)

## 2015-07-22 LAB — CSF CULTURE W GRAM STAIN
Culture: NO GROWTH
Gram Stain: NONE SEEN
Special Requests: NORMAL

## 2015-08-28 ENCOUNTER — Ambulatory Visit: Payer: BLUE CROSS/BLUE SHIELD | Attending: Internal Medicine

## 2015-08-28 DIAGNOSIS — G4733 Obstructive sleep apnea (adult) (pediatric): Secondary | ICD-10-CM | POA: Diagnosis not present

## 2015-08-28 DIAGNOSIS — I10 Essential (primary) hypertension: Secondary | ICD-10-CM | POA: Diagnosis present

## 2015-08-28 DIAGNOSIS — R0683 Snoring: Secondary | ICD-10-CM | POA: Diagnosis present

## 2015-12-01 ENCOUNTER — Emergency Department
Admission: EM | Admit: 2015-12-01 | Discharge: 2015-12-01 | Disposition: A | Discharge status: Home / Self Care | Payer: BLUE CROSS/BLUE SHIELD | Attending: Emergency Medicine | Admitting: Emergency Medicine

## 2015-12-01 ENCOUNTER — Encounter: Payer: Self-pay | Admitting: Medical Oncology

## 2015-12-01 DIAGNOSIS — K047 Periapical abscess without sinus: Secondary | ICD-10-CM | POA: Diagnosis present

## 2015-12-01 DIAGNOSIS — I10 Essential (primary) hypertension: Secondary | ICD-10-CM | POA: Diagnosis not present

## 2015-12-01 DIAGNOSIS — Z79899 Other long term (current) drug therapy: Secondary | ICD-10-CM | POA: Insufficient documentation

## 2015-12-01 MED ORDER — AMOXICILLIN-POT CLAVULANATE 875-125 MG PO TABS: 1.0000 | ORAL_TABLET | Freq: Two times a day (BID) | ORAL | 0 refills | Status: DC

## 2015-12-01 NOTE — ED Provider Notes (Signed)
[facility] Emergency Department Provider Note    ____________________________________________   None    (approximate)  I have reviewed the triage vital signs and the nursing notes.   HISTORY  Chief Complaint Abscess    HPI Danielle Tran is a 40 y.o. female patient complain of right lateral maxillary edema upon this morning awakening. Patient believes the pain is secondary to a dental problems. Patient states she's had right upper tooth discomfort over month. Patient states no acute pain but she was secondary to her chronic use of Percocets and anti-inflammatory medications for her neck problem. Patient denies any fever or chills associated this complaint. Patient is rating the pain today as 8/10. No other palliative measures for this complaint. Past Medical History:  Diagnosis Date  . Hypertension     Patient Active Problem List   Diagnosis Date Noted  . Intractable headache 07/18/2015    Past Surgical History:  Procedure Laterality Date  . BACK SURGERY      Prior to Admission medications   Medication Sig Start Date End Date Taking? Authorizing Provider  vitamin B-12 (CYANOCOBALAMIN) 100 MCG tablet Take 100 mcg by mouth daily.   Yes Historical Provider, MD  acetaZOLAMIDE (DIAMOX) 250 MG tablet Take 2 tablets (500 mg total) by mouth 3 (three) times daily. 07/21/15   Katha HammingSnehalatha Konidena, MD  amoxicillin-clavulanate (AUGMENTIN) 875-125 MG tablet Take 1 tablet by mouth 2 (two) times daily. 12/01/15   Joni Reiningonald K Draven Natter, PA-C  baclofen (LIORESAL) 10 MG tablet Take 1 tablet by mouth 3 (three) times daily as needed. 07/01/15   Historical Provider, MD  Cholecalciferol (VITAMIN D-1000 MAX ST) 1000 UNITS tablet Take 1,000 Units by mouth daily.     Historical Provider, MD  gabapentin (NEURONTIN) 300 MG capsule Take 300 mg by mouth 2 (two) times daily.  03/07/14   Historical Provider, MD  ibuprofen (ADVIL,MOTRIN) 200 MG tablet Take 200 mg by mouth every 6 (six)  hours as needed.    Historical Provider, MD  lisinopril (PRINIVIL,ZESTRIL) 10 MG tablet Take 10 mg by mouth daily.    Historical Provider, MD  oxyCODONE-acetaminophen (PERCOCET/ROXICET) 5-325 MG per tablet Take 1 tablet by mouth every 12 (twelve) hours.  03/07/14   Historical Provider, MD  traMADol (ULTRAM) 50 MG tablet Take 1 tablet (50 mg total) by mouth 2 (two) times daily. 07/21/15   Katha HammingSnehalatha Konidena, MD    Allergies   Family History  Problem Relation Age of Onset  . Diabetes Mellitus II Father   . Hypertension Father     Social History Social History  Substance Use Topics  . Smoking status: Never Smoker  . Smokeless tobacco: Not on file  . Alcohol use 0.0 oz/week    Review of Systems Constitutional: No fever/chills Eyes: No visual changes. ENT: No sore throat.Right upper molar dental pain Cardiovascular: Denies chest pain. Respiratory: Denies shortness of breath. Gastrointestinal: No abdominal pain.  No nausea, no vomiting.  No diarrhea.  No constipation. Genitourinary: Negative for dysuria. Musculoskeletal: Negative for back pain. Skin: Negative for rash. Neurological: Positive for headaches, denies focal weakness or numbness. Endocrine:Hypertension and borderline diabetes. ____________________________________________   PHYSICAL EXAM:  VITAL SIGNS: ED Triage Vitals  Enc Vitals Group     BP 12/01/15 1034 (!) 150/88     Pulse Rate 12/01/15 1034 89     Resp 12/01/15 1034 16     Temp 12/01/15 1034 98.1 F (36.7 C)     Temp Source 12/01/15 1034 Oral  SpO2 12/01/15 1034 97 %     Weight 12/01/15 1034 210 lb (95.3 kg)     Height 12/01/15 1034  (1.626 m)     Head Circumference --      Peak Flow --      Pain Score 12/01/15 1033 8     Pain Loc --      Pain Edu? --      Excl. in GC? --     Constitutional: Alert and oriented. Well appearing and in no acute distress. Eyes: Conjunctivae are normal. PERRL. EOMI. Head: Atraumatic. Nose: No  congestion/rhinnorhea. Mouth/Throat: Mucous membranes are moist.  Oropharynx non-erythematous. Edematous gingiva right upper molar. Neck: No stridor.  No cervical spine tenderness to palpation. Hematological/Lymphatic/Immunilogical: No cervical lymphadenopathy. Cardiovascular: Normal rate, regular rhythm. Grossly normal heart sounds.  Good peripheral circulation. Respiratory: Normal respiratory effort.  No retractions. Lungs CTAB. Gastrointestinal: Soft and nontender. No distention. No abdominal bruits. No CVA tenderness. Musculoskeletal: No lower extremity tenderness nor edema.  No joint effusions. Neurologic:  Normal speech and language. No gross focal neurologic deficits are appreciated. No gait instability. Skin:  Skin is warm, dry and intact. No rash noted. Psychiatric: Mood and affect are normal. Speech and behavior are normal.  ____________________________________________   LABS (all labs ordered are listed, but only abnormal results are displayed)  Labs Reviewed - No data to display ____________________________________________  EKG   ____________________________________________  RADIOLOGY   ____________________________________________   PROCEDURES  Procedure(s) performed: None  Procedures  Critical Care performed: No  ____________________________________________   INITIAL IMPRESSION / ASSESSMENT AND PLAN / ED COURSE  Pertinent labs & imaging results that were available during my care of the patient were reviewed by me and considered in my medical decision making (see chart for details).  Dental abscess. Patient given discharge care instructions. Patient given a prescription for Augmentin. Patient advised to follow-up with redness of dental clinics provided.  Clinical Course     ____________________________________________   FINAL CLINICAL IMPRESSION(S) / ED DIAGNOSES  Final diagnoses:  Dental abscess      NEW MEDICATIONS STARTED DURING THIS  VISIT:  New Prescriptions   AMOXICILLIN-CLAVULANATE (AUGMENTIN) 875-125 MG TABLET    Take 1 tablet by mouth 2 (two) times daily.     Note:  This document was prepared using Dragon voice recognition software and may include unintentional dictation errors.    Joni Reining, PA-C 12/01/15 1105    Jene Every, MD 12/01/15 [phone]

## 2015-12-01 NOTE — Discharge Instructions (Signed)
OPTIONS FOR DENTAL FOLLOW UP CARE ° °[place] Department of Health and Human Services - Local Safety Net Dental Clinics °http://www.ncdhhs.gov/dph/oralhealth/services/safetynetclinics.htm °  °[facility] (336-562-3123) ° °[facility] (919-933-9087) ° °[facility] ([phone] ext [phone]) ° °[facility] ([phone]) ° °[facility] ([phone]) °This clinic caters to the indigent population and is on a lottery system. °Location: °[facility], [address], [place] °Clinic Hours: °Wednesdays from 6pm - 9pm, patients seen by a lottery system. °For dates, call or go to www.med.[facility].edu/[facility]/patients/Dental-[facility] °Services: °Cleanings, fillings and simple extractions. °Payment Options: °DENTAL WORK IS FREE OF CHARGE. Bring proof of income or support. °Best way to get seen: °Arrive at 5:15 pm - this is a lottery, NOT first come/first serve, so arriving earlier will not increase your chances of being seen. °  °  °[facility] °919-537-3737 °Select option 1 for emergencies °  °Location: °[facility], [address], [place] °Clinic Hours: °No walk-ins accepted - call the day before to schedule an appointment. °Check in times are 9:30 am and 1:30 pm. °Services: °Simple extractions, temporary fillings, pulpectomy/pulp debridement, uncomplicated abscess drainage. °Payment Options: °PAYMENT IS DUE AT THE TIME OF SERVICE.  Fee is usually $100-200, additional surgical procedures (e.g. abscess drainage) may be extra. °Cash, checks, Visa/MasterCard accepted.  Can file Medicaid if patient is covered for dental - patient should call case worker to check. °No discount for [facility] patients. °Best way to get seen: °MUST call the day before and get onto the schedule. Can usually be seen the next 1-2 days. No walk-ins accepted. °  °  °[facility] °[phone] °   °Location: °[facility], [address], [place] °Clinic Hours: °M, W, Th, F 8am or 1:30pm, Tues 9a or 1:30 - first come/first served. °Services: °Simple extractions, temporary fillings, uncomplicated abscess drainage.  You do not need to be an [place] resident. °Payment Options: °PAYMENT IS DUE AT THE TIME OF SERVICE. °Dental insurance, otherwise sliding scale - bring proof of income or support. °Depending on income and treatment needed, cost is usually $50-200. °Best way to get seen: °Arrive early as it is first come/first served. °  °  °[facility] °[phone] °  °Location: °[address] °Clinic Hours: °Mon-Thu 8a-5p °Services: °Most basic dental services including extractions and fillings. °Payment Options: °PAYMENT IS DUE AT THE TIME OF SERVICE. °Sliding scale, up to 50% off - bring proof if income or support. °Medicaid with dental option accepted. °Best way to get seen: °Call to schedule an appointment, can usually be seen within 2 weeks OR they will try to see walk-ins - show up at 8a or 2p (you may have to wait). °  °  °[facility] °919-245-2435 °[place] RESIDENTS ONLY °  °Location: °[facility], [address], [place], [place] [address] °Clinic Hours: By appointment only. °Monday - Thursday 8am-5pm, Friday 8am-12pm °Services: Cleanings, fillings, extractions. °Payment Options: °PAYMENT IS DUE AT THE TIME OF SERVICE. °Cash, Visa or MasterCard. Sliding scale - $30 minimum per service. °Best way to get seen: °Come in to office, complete packet and make an appointment - need proof of income °or support monies for each household member and proof of [place] residence. °Usually takes about a month to get in. °  °  °[facility] °[phone] °  °Location: °[address]   [place] °Clinic Hours: Walk-in Urgent Care Dental Services are offered Monday-Friday  mornings only. °The numbers of emergencies accepted daily is limited to the number of °providers available. °Maximum 15 - Mondays, Wednesdays & Thursdays °Maximum 10 - Tuesdays & Fridays °Services: °You do not need to be a [place] resident to be seen for a dental emergency. °Emergencies are defined as pain, swelling, abnormal bleeding, or dental trauma. Walkins will receive x-rays if needed. °NOTE: Dental cleaning is not an emergency. °Payment Options: °PAYMENT IS DUE AT THE TIME OF SERVICE. °Minimum co-pay is $40.00 for uninsured patients. °Minimum co-pay is $3.00 for Medicaid with dental coverage. °Dental Insurance is accepted and must be presented at time of visit. °Medicare does not cover dental. °Forms of payment: Cash, credit card, checks. °Best way to get seen: °If not previously registered with the clinic, walk-in dental registration begins at 7:15 am and is on a first come/first serve basis. °If previously registered with the clinic, call to make an appointment. °  °  °[facility] °[phone] °[place] RESIDENTS ONLY °  °Location: °[address], [place], [place] °Clinic Hours: °Mon-Thu 10a-2p °Services: Extractions only! °Payment Options: °FREE (donations accepted) - bring proof of income or support °Best way to get seen: °Call and schedule an appointment OR come at 8am on the 1st Monday of every month (except for holidays) when it is first come/first served. °  °  °[facility] °[phone] °  °Location: °[address], [place] °Clinic Hours: °Friday mornings °Services, Payment Options, Best way to get seen: °Call for info °

## 2015-12-01 NOTE — ED Triage Notes (Signed)
Pt woke up this am with rt sided facial swelling.

## 2015-12-01 NOTE — ED Notes (Signed)
Woke up this am with swelling and redness to right side of face   Possible toothache  Describes pain as pressure to side of face

## 2017-08-20 ENCOUNTER — Emergency Department: Payer: Worker's Compensation

## 2017-08-20 ENCOUNTER — Other Ambulatory Visit: Payer: Self-pay

## 2017-08-20 ENCOUNTER — Emergency Department
Admission: EM | Admit: 2017-08-20 | Discharge: 2017-08-20 | Disposition: A | Discharge status: Home / Self Care | Payer: Worker's Compensation | Attending: Emergency Medicine | Admitting: Emergency Medicine

## 2017-08-20 ENCOUNTER — Encounter: Payer: Self-pay | Admitting: Emergency Medicine

## 2017-08-20 DIAGNOSIS — W208XXA Other cause of strike by thrown, projected or falling object, initial encounter: Secondary | ICD-10-CM | POA: Diagnosis not present

## 2017-08-20 DIAGNOSIS — Y929 Unspecified place or not applicable: Secondary | ICD-10-CM | POA: Insufficient documentation

## 2017-08-20 DIAGNOSIS — S0990XA Unspecified injury of head, initial encounter: Secondary | ICD-10-CM | POA: Diagnosis present

## 2017-08-20 DIAGNOSIS — Y990 Civilian activity done for income or pay: Secondary | ICD-10-CM | POA: Diagnosis not present

## 2017-08-20 DIAGNOSIS — S0003XA Contusion of scalp, initial encounter: Secondary | ICD-10-CM | POA: Insufficient documentation

## 2017-08-20 DIAGNOSIS — I10 Essential (primary) hypertension: Secondary | ICD-10-CM | POA: Insufficient documentation

## 2017-08-20 DIAGNOSIS — Z79899 Other long term (current) drug therapy: Secondary | ICD-10-CM | POA: Insufficient documentation

## 2017-08-20 DIAGNOSIS — F0781 Postconcussional syndrome: Secondary | ICD-10-CM | POA: Diagnosis not present

## 2017-08-20 DIAGNOSIS — Y939 Activity, unspecified: Secondary | ICD-10-CM | POA: Insufficient documentation

## 2017-08-20 NOTE — ED Triage Notes (Signed)
Pt to ED via POV. Pt states that she was at work and was reaching above her head to get a box off the shelf and the box fell hitting the left side of her head. Pt states that he box hitting her stunned her. Pt states that when she looks down her head spins. Pt was able to driver herself to the ED and was ambulatory to triage.

## 2017-08-20 NOTE — Discharge Instructions (Addendum)
Follow-up with your companies Workmen's Comp. doctor if any continued problems.  Ice to your scalp as needed for discomfort or swelling.  Tylenol as needed for pain or headache.  Light duty for the next 3 to 4 days.  Return to the emergency department if any severe worsening of your symptoms.

## 2017-08-20 NOTE — ED Provider Notes (Signed)
[facility] Emergency Department Provider Note  ___________________________________________   First MD Initiated Contact with Patient 08/20/17 1419     (approximate)  I have reviewed the triage vital signs and the nursing notes.   HISTORY  Chief Complaint workers comp   HPI Danielle Tran is a 41 y.o. female is here after an injury that occurred at work.  Patient states that she was reaching to get a box off the shelf when the box hit her on the left side of her head.  She denies any loss of consciousness but states that she was "stunned".  She states that since that time she has had some dizziness and blurred vision.  There is been some nausea.  Patient continues to have some headache and moderate tenderness on palpation of the left temporal area.  She rates her pain as 4 out of 10.   Past Medical History:  Diagnosis Date  . Hypertension     Patient Active Problem List   Diagnosis Date Noted  . Intractable headache 07/18/2015    Past Surgical History:  Procedure Laterality Date  . BACK SURGERY      Prior to Admission medications   Medication Sig Start Date End Date Taking? Authorizing Provider  acetaZOLAMIDE (DIAMOX) 250 MG tablet Take 2 tablets (500 mg total) by mouth 3 (three) times daily. 07/21/15   Katha Hamming, MD  Cholecalciferol (VITAMIN D-1000 MAX ST) 1000 UNITS tablet Take 1,000 Units by mouth daily.     [provider]  gabapentin (NEURONTIN) 300 MG capsule Take 300 mg by mouth 2 (two) times daily.  03/07/14   [provider]  ibuprofen (ADVIL,MOTRIN) 200 MG tablet Take 200 mg by mouth every 6 (six) hours as needed.    [provider]  lisinopril (PRINIVIL,ZESTRIL) 10 MG tablet Take 10 mg by mouth daily.    [provider]  oxyCODONE-acetaminophen (PERCOCET/ROXICET) 5-325 MG per tablet Take 1 tablet by mouth every 12 (twelve) hours.  03/07/14   [provider]  traMADol (ULTRAM) 50 MG  tablet Take 1 tablet (50 mg total) by mouth 2 (two) times daily. 07/21/15   Katha Hamming, MD  vitamin B-12 (CYANOCOBALAMIN) 100 MCG tablet Take 100 mcg by mouth daily.    [provider]    Allergies Trazodone; Excedrin extra strength [aspirin-acetaminophen-caffeine]; Guaifenesin; Hydrocodone; Maprotiline; and Morphine  Family History  Problem Relation Age of Onset  . Diabetes Mellitus II Father   . Hypertension Father     Social History Social History   Tobacco Use  . Smoking status: Never Smoker  . Smokeless tobacco: Never Used  Substance Use Topics  . Alcohol use: Yes    Alcohol/week: 0.0 oz  . Drug use: Not Currently    Review of Systems Constitutional: No fever/chills Eyes: Positive for blurred vision. ENT: Trauma. Cardiovascular: Denies chest pain. Respiratory: Denies shortness of breath. Gastrointestinal:   No nausea, no vomiting.  Musculoskeletal: Negative for back pain. Skin: No abrasions or ecchymosis Neurological: Positive for headaches, no focal weakness or numbness. ___________________________________________   PHYSICAL EXAM:  VITAL SIGNS: ED Triage Vitals [08/20/17 1349]  Enc Vitals Group     BP      Pulse      Resp      Temp      Temp src      SpO2      Weight 210 lb (95.3 kg)     Height 5\' 4"  (1.626 m)     Head  Circumference      Peak Flow      Pain Score 4     Pain Loc      Pain Edu?      Excl. in GC?    Constitutional: Alert and oriented. Well appearing and in no acute distress. Eyes: Conjunctivae are normal. PERRL. EOMI. Head: Moderate tenderness on palpation of the left temple area without soft tissue edema or abrasions.  No gross deformity was observed. Nose: No trauma. Neck: No stridor.  No cervical tenderness on palpation posteriorly. Cardiovascular: Normal rate, regular rhythm. Grossly normal heart sounds.  Good peripheral circulation. Respiratory: Normal respiratory effort.  No retractions. Lungs  CTAB. Musculoskeletal: Moves upper and lower extremities without any difficulty.  Good muscle strength bilaterally. Neurologic:  Normal speech and language. No gross focal neurologic deficits are appreciated.  Hernial nerves II through XII grossly intact.  No gait instability. Skin:  Skin is warm, dry and intact.  No abrasions or ecchymosis present.  No soft tissue swelling is noted at the left temporal area. Psychiatric: Mood and affect are normal. Speech and behavior are normal.  ____________________________________________   LABS (all labs ordered are listed, but only abnormal results are displayed)  Labs Reviewed - No data to display  RADIOLOGY   Official radiology report(s): Ct Head Wo Contrast  Result Date: 08/20/2017 CLINICAL DATA:  Box fell onto head. No loss of consciousness. Head pain. Neck pain. Dizziness. EXAM: CT HEAD WITHOUT CONTRAST CT CERVICAL SPINE WITHOUT CONTRAST TECHNIQUE: Multidetector CT imaging of the head and cervical spine was performed following the standard protocol without intravenous contrast. Multiplanar CT image reconstructions of the cervical spine were also generated. COMPARISON:  None. FINDINGS: CT HEAD FINDINGS Brain: No evidence for acute infarction, hemorrhage, mass lesion, hydrocephalus, or extra-axial fluid. Vascular: No hyperdense vessel or unexpected calcification. Skull: Normal. Negative for fracture or focal lesion. Sinuses/Orbits: No acute finding. Other: None. CT CERVICAL SPINE FINDINGS Alignment: Anatomic Skull base and vertebrae: No acute fracture. No primary bone lesion or focal pathologic process. Soft tissues and spinal canal: No prevertebral fluid or swelling. No visible canal hematoma. Disc levels:  Mild spondylosis at C5-6 and C6-7. Upper chest: Negative. Other: None. IMPRESSION: No skull fracture or intracranial hemorrhage. No cervical spine fracture or traumatic subluxation. Electronically Signed   By: Elsie StainJohn T Curnes M.D.   On: 08/20/2017 15:30    Ct Cervical Spine Wo Contrast  Result Date: 08/20/2017 CLINICAL DATA:  Box fell onto head. No loss of consciousness. Head pain. Neck pain. Dizziness. EXAM: CT HEAD WITHOUT CONTRAST CT CERVICAL SPINE WITHOUT CONTRAST TECHNIQUE: Multidetector CT imaging of the head and cervical spine was performed following the standard protocol without intravenous contrast. Multiplanar CT image reconstructions of the cervical spine were also generated. COMPARISON:  None. FINDINGS: CT HEAD FINDINGS Brain: No evidence for acute infarction, hemorrhage, mass lesion, hydrocephalus, or extra-axial fluid. Vascular: No hyperdense vessel or unexpected calcification. Skull: Normal. Negative for fracture or focal lesion. Sinuses/Orbits: No acute finding. Other: None. CT CERVICAL SPINE FINDINGS Alignment: Anatomic Skull base and vertebrae: No acute fracture. No primary bone lesion or focal pathologic process. Soft tissues and spinal canal: No prevertebral fluid or swelling. No visible canal hematoma. Disc levels:  Mild spondylosis at C5-6 and C6-7. Upper chest: Negative. Other: None. IMPRESSION: No skull fracture or intracranial hemorrhage. No cervical spine fracture or traumatic subluxation. Electronically Signed   By: Elsie StainJohn T Curnes M.D.   On: 08/20/2017 15:30    ____________________________________________   PROCEDURES  Procedure(s) performed:  None  Procedures  Critical Care performed: No  ____________________________________________   INITIAL IMPRESSION / ASSESSMENT AND PLAN / ED COURSE  As part of my medical decision making, I reviewed the following data within the electronic MEDICAL RECORD NUMBER Notes from prior ED visits and [place] Controlled Substance Database  She was reassured that CT head neck was negative for any acute injury.  She is continue taking Tylenol as needed for headache and use ice to her scalp as needed for swelling and pain.  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  She will follow-up with her companies doctor if any continued problems.  She will return to the emergency department if any severe worsening of her symptoms. ____________________________________________   FINAL CLINICAL IMPRESSION(S) / ED DIAGNOSES  Final diagnoses:  Contusion of left temporofrontal scalp, initial encounter  Post concussive syndrome     ED Discharge Orders    None       Note:  This document was prepared using Dragon voice recognition software and may include unintentional dictation errors.    Tommi Rumps, PA-C 08/20/17 1626    Emily Filbert, MD 08/21/17 [phone]

## 2017-08-20 NOTE — ED Notes (Signed)
See triage note   States she was lifting a box with additional cardboard inside over her head  The box came back and hit left side of head  No loc  But feels light headed with some vision changes

## 2017-10-22 ENCOUNTER — Other Ambulatory Visit: Payer: Self-pay | Admitting: Obstetrics and Gynecology

## 2017-10-22 DIAGNOSIS — Z1231 Encounter for screening mammogram for malignant neoplasm of breast: Secondary | ICD-10-CM

## 2017-11-12 ENCOUNTER — Ambulatory Visit
Admission: RE | Admit: 2017-11-12 | Discharge: 2017-11-12 | Disposition: A | Discharge status: Home / Self Care | Payer: BLUE CROSS/BLUE SHIELD | Source: Ambulatory Visit | Attending: Obstetrics and Gynecology | Admitting: Obstetrics and Gynecology

## 2017-11-12 DIAGNOSIS — Z1231 Encounter for screening mammogram for malignant neoplasm of breast: Secondary | ICD-10-CM | POA: Diagnosis present

## 2018-07-04 ENCOUNTER — Other Ambulatory Visit (HOSPITAL_COMMUNITY): Payer: Self-pay | Admitting: Neurology

## 2018-07-04 ENCOUNTER — Other Ambulatory Visit: Payer: Self-pay | Admitting: Neurology

## 2018-07-04 DIAGNOSIS — R51 Headache: Principal | ICD-10-CM

## 2018-07-04 DIAGNOSIS — H9311 Tinnitus, right ear: Secondary | ICD-10-CM

## 2018-07-04 DIAGNOSIS — G8929 Other chronic pain: Secondary | ICD-10-CM

## 2018-07-12 ENCOUNTER — Ambulatory Visit
Admission: RE | Admit: 2018-07-12 | Discharge: 2018-07-12 | Disposition: A | Discharge status: Home / Self Care | Payer: BLUE CROSS/BLUE SHIELD | Source: Ambulatory Visit | Attending: Neurology | Admitting: Neurology

## 2018-07-12 ENCOUNTER — Other Ambulatory Visit: Payer: Self-pay

## 2018-07-12 DIAGNOSIS — R519 Headache, unspecified: Secondary | ICD-10-CM

## 2018-07-12 DIAGNOSIS — R51 Headache: Secondary | ICD-10-CM | POA: Diagnosis present

## 2018-07-12 DIAGNOSIS — H9311 Tinnitus, right ear: Secondary | ICD-10-CM | POA: Diagnosis present

## 2018-07-29 ENCOUNTER — Other Ambulatory Visit: Payer: Self-pay | Admitting: Neurology

## 2018-07-29 DIAGNOSIS — R519 Headache, unspecified: Secondary | ICD-10-CM

## 2018-07-29 DIAGNOSIS — R51 Headache: Principal | ICD-10-CM

## 2018-08-02 ENCOUNTER — Other Ambulatory Visit: Payer: Self-pay

## 2018-08-02 ENCOUNTER — Ambulatory Visit
Admission: RE | Admit: 2018-08-02 | Discharge: 2018-08-02 | Disposition: A | Discharge status: Home / Self Care | Payer: BLUE CROSS/BLUE SHIELD | Source: Ambulatory Visit | Attending: Neurology | Admitting: Neurology

## 2018-08-02 DIAGNOSIS — R51 Headache: Secondary | ICD-10-CM | POA: Diagnosis present

## 2018-08-02 DIAGNOSIS — R519 Headache, unspecified: Secondary | ICD-10-CM

## 2018-08-02 HISTORY — DX: Headache: R51

## 2018-08-02 HISTORY — DX: Dorsalgia, unspecified: M54.9

## 2018-08-02 HISTORY — DX: Headache, unspecified: R51.9

## 2018-08-02 LAB — CBC
HCT: 41.5 % (ref 36.0–46.0)
Hemoglobin: 13.3 g/dL (ref 12.0–15.0)
MCH: 27.9 pg (ref 26.0–34.0)
MCHC: 32 g/dL (ref 30.0–36.0)
MCV: 87 fL (ref 80.0–100.0)
Platelets: 351 10*3/uL (ref 150–400)
RBC: 4.77 MIL/uL (ref 3.87–5.11)
RDW: 12.9 % (ref 11.5–15.5)
WBC: 11 10*3/uL — ABNORMAL HIGH (ref 4.0–10.5)
nRBC: 0 % (ref 0.0–0.2)

## 2018-08-02 LAB — PROTIME-INR
INR: 0.9 (ref 0.8–1.2)
Prothrombin Time: 12.5 seconds (ref 11.4–15.2)

## 2018-08-02 LAB — APTT: aPTT: 29 seconds (ref 24–36)

## 2018-08-02 MED ORDER — LIDOCAINE HCL (PF) 1 % IJ SOLN
5.0000 mL | Freq: Once | INTRAMUSCULAR | Status: AC
  Administered 2018-08-02: 5 mL
  Filled 2018-08-02: qty 5

## 2018-08-02 NOTE — OR Nursing (Signed)
To Post op room 23 at 10:05.  No distress .  Has headache but doesn't want medication.  Requested aromatherapy with lavender on a cotton ball.

## 2018-08-02 NOTE — Procedures (Signed)
LP L L3/4 Opening Pressure - 28 cm water Closing Pressure - 12 cm water EBL 0 Comp 0

## 2018-08-02 NOTE — Progress Notes (Signed)
Pt in post op for 4 hours. VSS, pt alert and oriented. Ambulating independently.  Headache 2 out of 10; does not want medication. Tolerating liquids. No distress. Discharged with son, Danielle Tran, in [place] [employer].

## 2018-12-14 ENCOUNTER — Other Ambulatory Visit: Payer: Self-pay | Admitting: Internal Medicine

## 2018-12-14 DIAGNOSIS — Z1231 Encounter for screening mammogram for malignant neoplasm of breast: Secondary | ICD-10-CM

## 2019-05-04 ENCOUNTER — Other Ambulatory Visit: Payer: Self-pay | Admitting: Neurology

## 2019-05-04 DIAGNOSIS — G932 Benign intracranial hypertension: Secondary | ICD-10-CM

## 2019-05-09 ENCOUNTER — Other Ambulatory Visit: Payer: Self-pay

## 2019-05-09 ENCOUNTER — Ambulatory Visit
Admission: RE | Admit: 2019-05-09 | Discharge: 2019-05-09 | Disposition: A | Discharge status: Home / Self Care | Payer: 59 | Source: Ambulatory Visit | Attending: Neurology | Admitting: Neurology

## 2019-05-09 DIAGNOSIS — G932 Benign intracranial hypertension: Secondary | ICD-10-CM | POA: Insufficient documentation

## 2019-05-09 LAB — APTT: aPTT: 29 seconds (ref 24–36)

## 2019-05-09 LAB — CBC
HCT: 41.2 % (ref 36.0–46.0)
Hemoglobin: 12.8 g/dL (ref 12.0–15.0)
MCH: 27.5 pg (ref 26.0–34.0)
MCHC: 31.1 g/dL (ref 30.0–36.0)
MCV: 88.4 fL (ref 80.0–100.0)
Platelets: 298 10*3/uL (ref 150–400)
RBC: 4.66 MIL/uL (ref 3.87–5.11)
RDW: 13.2 % (ref 11.5–15.5)
WBC: 6.5 10*3/uL (ref 4.0–10.5)
nRBC: 0 % (ref 0.0–0.2)

## 2019-05-09 LAB — PROTIME-INR
INR: 0.9 (ref 0.8–1.2)
Prothrombin Time: 12.4 seconds (ref 11.4–15.2)

## 2019-05-09 MED ORDER — LIDOCAINE HCL (PF) 1 % IJ SOLN
5.0000 mL | Freq: Once | INTRAMUSCULAR | Status: AC
  Administered 2019-05-09: 5 mL
  Filled 2019-05-09: qty 5

## 2019-05-09 NOTE — Progress Notes (Signed)
Pt. Stable after lp.back stable dermatitis/c instructions given father/u with her MD.

## 2019-05-09 NOTE — Discharge Instructions (Signed)
Lumbar Puncture, Care After This sheet gives you information about how to care for yourself after your procedure. Your health care provider may also give you more specific instructions. If you have problems or questions, contact your health care provider. What can I expect after the procedure? After the procedure, it is common to have:  Mild discomfort or pain at the puncture site.  A mild headache that is relieved with pain medicines. Follow these instructions at home: Activity   Lie down flat or rest for as long as directed by your health care provider.  Return to your normal activities as told by your health care provider. Ask your health care provider what activities are safe for you.  Avoid lifting anything heavier than 10 lb (4.5 kg) for at least 12 hours after the procedure.  Do not drive for 24 hours if you were given a medicine to help you relax (sedative) during your procedure.  Do not drive or use heavy machinery while taking prescription pain medicine. Puncture site care  Remove or change your bandage (dressing) as told by your health care provider.  Check your puncture area every day for signs of infection. Check for: ? More pain. ? Redness or swelling. ? Fluid or blood leaking from the puncture site. ? Warmth. ? Pus or a bad smell. General instructions  Take over-the-counter and prescription medicines only as told by your health care provider.  Drink enough fluids to keep your urine clear or pale yellow. Your health care provider may recommend drinking caffeine to prevent a headache.  Keep all follow-up visits as told by your health care provider. This is important. Contact a health care provider if:  You have fever or chills.  You have nausea or vomiting.  You have a headache that lasts for more than 2 days or does not get better with medicine. Get help right away if:  You develop any of the following in your  legs: ? Weakness. ? Numbness. ? Tingling.  You are unable to control when you urinate or have a bowel movement (incontinence).  You have signs of infection around your puncture site, such as: ? More pain. ? Redness or swelling. ? Fluid or blood leakage. ? Warmth. ? Pus or a bad smell.  You are dizzy or you feel like you might faint.  You have a severe headache, especially when you sit or stand. Summary  A lumbar puncture is a procedure in which a small needle is inserted into the lower back to remove fluid that surrounds the brain and spinal cord.  After this procedure, it is common to have a headache and pain around the needle insertion area.  Lying flat, staying hydrated, and drinking caffeine can help prevent headaches.  Monitor your needle insertion site for signs of infection, including warmth, fluid, or more pain.  Get help right away if you develop leg weakness, leg numbness, incontinence, or severe headaches. This information is not intended to replace advice given to you by your health care provider. Make sure you discuss any questions you have with your health care provider. Document Revised: 05/27/2016 Document Reviewed: 05/27/2016 Elsevier Patient Education  2020 Elsevier Inc.  *FOLLOW UP WITH DR POTTER AS INSTRUCTED* 

## 2019-05-09 NOTE — Progress Notes (Signed)
Patient returned to room 24 in same day surgery; bed flat as discussed per radiology.  Will continue to monitor patient; skin warm/dry.  Respirations even and unlabored. Patient offered food/drink.  Will await discharge instructions.

## 2019-06-16 ENCOUNTER — Other Ambulatory Visit: Payer: Self-pay | Admitting: Internal Medicine

## 2019-06-16 DIAGNOSIS — Z1231 Encounter for screening mammogram for malignant neoplasm of breast: Secondary | ICD-10-CM

## 2019-09-15 ENCOUNTER — Other Ambulatory Visit: Payer: Self-pay | Admitting: Anesthesiology

## 2019-09-15 DIAGNOSIS — M961 Postlaminectomy syndrome, not elsewhere classified: Secondary | ICD-10-CM

## 2019-10-06 ENCOUNTER — Ambulatory Visit
Admission: RE | Admit: 2019-10-06 | Discharge: 2019-10-06 | Disposition: A | Discharge status: Home / Self Care | Payer: 59 | Source: Ambulatory Visit | Attending: Anesthesiology | Admitting: Anesthesiology

## 2019-10-06 DIAGNOSIS — M961 Postlaminectomy syndrome, not elsewhere classified: Secondary | ICD-10-CM

## 2019-10-09 ENCOUNTER — Ambulatory Visit
Admission: RE | Admit: 2019-10-09 | Discharge: 2019-10-09 | Disposition: A | Discharge status: Home / Self Care | Payer: 59 | Source: Ambulatory Visit | Attending: Anesthesiology | Admitting: Anesthesiology

## 2019-10-09 DIAGNOSIS — M961 Postlaminectomy syndrome, not elsewhere classified: Secondary | ICD-10-CM | POA: Insufficient documentation

## 2020-11-16 ENCOUNTER — Emergency Department
Admission: EM | Admit: 2020-11-16 | Discharge: 2020-11-16 | Disposition: A | Discharge status: Home / Self Care | Payer: PRIVATE HEALTH INSURANCE | Attending: Emergency Medicine | Admitting: Emergency Medicine

## 2020-11-16 ENCOUNTER — Other Ambulatory Visit: Payer: Self-pay

## 2020-11-16 ENCOUNTER — Encounter: Payer: Self-pay | Admitting: Emergency Medicine

## 2020-11-16 DIAGNOSIS — M961 Postlaminectomy syndrome, not elsewhere classified: Secondary | ICD-10-CM | POA: Insufficient documentation

## 2020-11-16 DIAGNOSIS — I10 Essential (primary) hypertension: Secondary | ICD-10-CM | POA: Diagnosis not present

## 2020-11-16 DIAGNOSIS — Z79899 Other long term (current) drug therapy: Secondary | ICD-10-CM | POA: Insufficient documentation

## 2020-11-16 DIAGNOSIS — Z87891 Personal history of nicotine dependence: Secondary | ICD-10-CM | POA: Diagnosis not present

## 2020-11-16 DIAGNOSIS — M5442 Lumbago with sciatica, left side: Secondary | ICD-10-CM

## 2020-11-16 DIAGNOSIS — M545 Low back pain, unspecified: Secondary | ICD-10-CM | POA: Diagnosis present

## 2020-11-16 MED ORDER — HYDROMORPHONE HCL 1 MG/ML IJ SOLN
1.0000 mg | Freq: Once | INTRAMUSCULAR | Status: AC
  Administered 2020-11-16: 1 mg via INTRAMUSCULAR
  Filled 2020-11-16: qty 1

## 2020-11-16 MED ORDER — LIDOCAINE 4 % EX PTCH: 1.0000 | MEDICATED_PATCH | Freq: Two times a day (BID) | CUTANEOUS | 0 refills | Status: AC

## 2020-11-16 MED ORDER — PREDNISONE 10 MG PO TABS: 10.0000 mg | ORAL_TABLET | ORAL | 0 refills | Status: AC

## 2020-11-16 MED ORDER — MELOXICAM 15 MG PO TABS: 15.0000 mg | ORAL_TABLET | Freq: Every day | ORAL | 0 refills | Status: AC

## 2020-11-16 MED ORDER — METHOCARBAMOL 500 MG PO TABS: 500.0000 mg | ORAL_TABLET | Freq: Four times a day (QID) | ORAL | 0 refills | Status: AC

## 2020-11-16 MED ORDER — DEXAMETHASONE SODIUM PHOSPHATE 10 MG/ML IJ SOLN
10.0000 mg | Freq: Once | INTRAMUSCULAR | Status: AC
  Administered 2020-11-16: 10 mg via INTRAMUSCULAR
  Filled 2020-11-16: qty 1

## 2020-11-16 MED ORDER — ORPHENADRINE CITRATE 30 MG/ML IJ SOLN
60.0000 mg | Freq: Once | INTRAMUSCULAR | Status: AC
  Administered 2020-11-16: 60 mg via INTRAMUSCULAR
  Filled 2020-11-16: qty 2

## 2020-11-16 NOTE — ED Triage Notes (Signed)
Pt via POV from home. Pt c/o back pain that radiates down her L leg. Pt is already seen at the pain clinic for this. Pt states that she had a nerve block done a week and a half ago but that did not help with the pain. Pt is A&Ox4 and NAD.

## 2020-11-16 NOTE — ED Notes (Signed)
Pt c/o lower back pain that radiates into the left leg. Pt states she had a nerve block performed and the pain worsened after the procedure. See also the triage note.

## 2020-11-16 NOTE — ED Provider Notes (Signed)
[facility] Emergency Department Provider Note  ____________________________________________  Time seen: Approximately 10:41 PM  I have reviewed the triage vital signs and the nursing notes.   HISTORY  Chief Complaint Back Pain    HPI Danielle Tran is a 45 y.o. female who presents the emergency department complaining of increased left-sided lower back pain.  Patient has a history of postlaminectomy syndrome.  Patient had an epidural injection placed by pain management a week ago and has had increased pain since.  Pain does radiate into the left leg but there is no bowel or bladder dysfunction, saddle anesthesia, paresthesias.  No fevers or chills.  No headache.  No urinary or GI complaints.  Patient is here for increased pain.  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       Past Medical History:  Diagnosis Date   Back pain    Headache    Hypertension     Patient Active Problem List   Diagnosis Date Noted   Intractable headache 07/18/2015    Past Surgical History:  Procedure Laterality Date   ABDOMINAL HYSTERECTOMY     BACK SURGERY     REDUCTION MAMMAPLASTY Bilateral 1994    Prior to Admission medications   Medication Sig Start Date End Date Taking? Authorizing Provider  Lidocaine (HM LIDOCAINE PATCH) 4 % PTCH Apply 1 patch topically every 12 (twelve) hours for 10 days. 11/16/20 11/26/20 Yes Jihad Brownlow, Delorise Royals, PA-C  meloxicam (MOBIC) 15 MG tablet Take 1 tablet (15 mg total) by mouth daily. 11/16/20  Yes Frankie Zito, Delorise Royals, PA-C  methocarbamol (ROBAXIN) 500 MG tablet Take 1 tablet (500 mg total) by mouth 4 (four) times daily. 11/16/20  Yes Legna Mausolf, Delorise Royals, PA-C  predniSONE (DELTASONE) 10 MG tablet Take 1 tablet (10 mg total) by mouth as directed. 11/16/20  Yes Kalik Hoare, Delorise Royals, PA-C  acetaZOLAMIDE (DIAMOX) 250 MG tablet Take 2 tablets (500 mg total) by mouth 3 (three) times daily. 07/21/15   Katha Hamming, MD  acetaZOLAMIDE  (DIAMOX) 500 MG capsule Take 500 mg by mouth every evening.    [provider]  atorvastatin (LIPITOR) 10 MG tablet Take 10 mg by mouth daily at 6 PM.    [provider]  baclofen (LIORESAL) 10 MG tablet Take 10 mg by mouth.    [provider]  busPIRone (BUSPAR) 5 MG tablet Take 5 mg by mouth 2 (two) times daily.    [provider]  Cholecalciferol (VITAMIN D-1000 MAX ST) 1000 UNITS tablet Take 1,000 Units by mouth daily.     [provider]  escitalopram (LEXAPRO) 10 MG tablet Take 10 mg by mouth daily.    [provider]  gabapentin (NEURONTIN) 300 MG capsule Take 300 mg by mouth 2 (two) times daily.  03/07/14   [provider]  ibuprofen (ADVIL,MOTRIN) 200 MG tablet Take 200 mg by mouth every 6 (six) hours as needed.    [provider]  lisinopril (PRINIVIL,ZESTRIL) 10 MG tablet Take 10 mg by mouth daily.    [provider]  nortriptyline (PAMELOR) 10 MG capsule Take 20 mg by mouth at bedtime.    [provider]  oxyCODONE-acetaminophen (PERCOCET/ROXICET) 5-325 MG per tablet Take 1 tablet by mouth every 12 (twelve) hours.  03/07/14   [provider]  rOPINIRole (REQUIP) 0.5 MG tablet Take 0.5 mg by mouth at bedtime.    [provider]  traMADol (ULTRAM) 50 MG tablet Take 1 tablet (50 mg total) by mouth  2 (two) times daily. 07/21/15   Katha Hamming, MD  vitamin B-12 (CYANOCOBALAMIN) 100 MCG tablet Take 100 mcg by mouth daily.    [provider]    Allergies Excedrin extra strength [aspirin-acetaminophen-caffeine], Maprotiline, Trazodone, Guaifenesin, Hydrocodone, and Morphine  Family History  Problem Relation Age of Onset   Diabetes Mellitus II Father    Hypertension Father     Social History Social History   Tobacco Use   Smoking status: Former    Types: Cigarettes    Quit date: 08/02/2011    Years since quitting: 9.2   Smokeless tobacco: Never  Vaping Use    Vaping Use: Never used  Substance Use Topics   Alcohol use: Yes    Alcohol/week: 0.0 standard drinks    Comment: rarely   Drug use: Not Currently     Review of Systems  Constitutional: No fever/chills Eyes: No visual changes. No discharge ENT: No upper respiratory complaints. Cardiovascular: no chest pain. Respiratory: no cough. No SOB. Gastrointestinal: No abdominal pain.  No nausea, no vomiting.  No diarrhea.  No constipation. Genitourinary: Negative for dysuria. No hematuria Musculoskeletal: Increased left-sided lower back pain Skin: Negative for rash, abrasions, lacerations, ecchymosis. Neurological: Negative for headaches, focal weakness or numbness.  10 System ROS otherwise negative.  ____________________________________________   PHYSICAL EXAM:  VITAL SIGNS: ED Triage Vitals [11/16/20 1633]  Enc Vitals Group     BP 130/71     Pulse Rate 82     Resp 20     Temp 98.9 F (37.2 C)     Temp Source Oral     SpO2 100 %     Weight 184 lb (83.5 kg)     Height 5\' 4"  (1.626 m)     Head Circumference      Peak Flow      Pain Score 10     Pain Loc      Pain Edu?      Excl. in GC?      Constitutional: Alert and oriented. Well appearing and in no acute distress. Eyes: Conjunctivae are normal. PERRL. EOMI. Head: Atraumatic. ENT:      Ears:       Nose: No congestion/rhinnorhea.      Mouth/Throat: Mucous membranes are moist.  Neck: No stridor.    Cardiovascular: Normal rate, regular rhythm. Normal S1 and S2.  Good peripheral circulation. Respiratory: Normal respiratory effort without tachypnea or retractions. Lungs CTAB. Good air entry to the bases with no decreased or absent breath sounds. Gastrointestinal: Bowel sounds 4 quadrants. Soft and nontender to palpation. No guarding or rigidity. No palpable masses. No distention. No CVA tenderness. Musculoskeletal: Full range of motion to all extremities. No gross deformities appreciated.  Visualization of the lumbar  spine reveals no erythema, edema.  Nontender midline or right paraspinal muscle, but is tender on the left sitting into the SI joint and sciatic notch.  No palpable abnormality.  Dorsalis pedis pulse and sensation intact and equal bilateral lower extremities. Neurologic:  Normal speech and language. No gross focal neurologic deficits are appreciated.  Skin:  Skin is warm, dry and intact. No rash noted. Psychiatric: Mood and affect are normal. Speech and behavior are normal. Patient exhibits appropriate insight and judgement.   ____________________________________________   LABS (all labs ordered are listed, but only abnormal results are displayed)  Labs Reviewed - No data to display ____________________________________________  EKG   ____________________________________________  RADIOLOGY   No results found.  ____________________________________________    PROCEDURES  Procedure(s) performed:    Procedures    Medications  HYDROmorphone (DILAUDID) injection 1 mg (1 mg Intramuscular Given 11/16/20 2243)  dexamethasone (DECADRON) injection 10 mg (10 mg Intramuscular Given 11/16/20 2243)  orphenadrine (NORFLEX) injection 60 mg (60 mg Intramuscular Given 11/16/20 2243)     ____________________________________________   INITIAL IMPRESSION / ASSESSMENT AND PLAN / ED COURSE  Pertinent labs & imaging results that were available during my care of the patient were reviewed by me and considered in my medical decision making (see chart for details).  Review of the [place] CSRS was performed in accordance of the [facility] prior to dispensing any controlled drugs.           Patient's diagnosis is consistent with lumbar postlaminectomy syndrome and sciatica.  Patient presented to the emergency department with increased lower back pain after receiving an epidural injection a week ago from pain management.  Patient had no improvement of symptoms and in fact had worsening symptoms after the  injection.  There was no concerning neuro deficits.  No fevers or headache.  Symptoms are primarily located along the spinal border/paraspinal muscle group on the left side.  No tenderness midline.  No concerning neuro symptoms.  This time no immediate indication for labs or imaging.  Patient be given prednisone, Robaxin, Mobic and lidocaine patches at home.  Continue her gabapentin and prescribed pain medication.  Here in the ED patient is given Decadron, Norflex and Dilaudid injections..  Follow-up with pain management or neurosurgery as needed.  Return precautions are discussed with the patient.  Patient is given ED precautions to return to the ED for any worsening or new symptoms.     ____________________________________________  FINAL CLINICAL IMPRESSION(S) / ED DIAGNOSES  Final diagnoses:  Lumbar post-laminectomy syndrome  Acute left-sided low back pain with left-sided sciatica      NEW MEDICATIONS STARTED DURING THIS VISIT:  ED Discharge Orders          Ordered    predniSONE (DELTASONE) 10 MG tablet  As directed       Note to Pharmacy: Take on a pattern of 6, 6, 5, 5, 4, 4, 3, 3, 2, 2, 1, 1   11/16/20 2241    methocarbamol (ROBAXIN) 500 MG tablet  4 times daily        11/16/20 2241    meloxicam (MOBIC) 15 MG tablet  Daily        11/16/20 2241    Lidocaine (HM LIDOCAINE PATCH) 4 % PTCH  Every 12 hours        11/16/20 2241                This chart was dictated using voice recognition software/Dragon. Despite best efforts to proofread, errors can occur which can change the meaning. Any change was purely unintentional.    Racheal Patches, PA-C 11/16/20 2244    Phineas Semen, MD 11/16/20 2250

## 2020-12-25 ENCOUNTER — Emergency Department
Admission: EM | Admit: 2020-12-25 | Discharge: 2020-12-25 | Discharge status: Against Medical Advice | Payer: No Typology Code available for payment source | Attending: Emergency Medicine | Admitting: Emergency Medicine

## 2020-12-25 ENCOUNTER — Other Ambulatory Visit: Payer: Self-pay

## 2020-12-25 ENCOUNTER — Emergency Department: Payer: No Typology Code available for payment source

## 2020-12-25 ENCOUNTER — Encounter: Payer: Self-pay | Admitting: Emergency Medicine

## 2020-12-25 DIAGNOSIS — Z79899 Other long term (current) drug therapy: Secondary | ICD-10-CM | POA: Insufficient documentation

## 2020-12-25 DIAGNOSIS — Z87891 Personal history of nicotine dependence: Secondary | ICD-10-CM | POA: Insufficient documentation

## 2020-12-25 DIAGNOSIS — F12129 Cannabis abuse with intoxication, unspecified: Secondary | ICD-10-CM | POA: Insufficient documentation

## 2020-12-25 DIAGNOSIS — R4182 Altered mental status, unspecified: Secondary | ICD-10-CM | POA: Diagnosis present

## 2020-12-25 DIAGNOSIS — I10 Essential (primary) hypertension: Secondary | ICD-10-CM | POA: Insufficient documentation

## 2020-12-25 DIAGNOSIS — F12920 Cannabis use, unspecified with intoxication, uncomplicated: Secondary | ICD-10-CM

## 2020-12-25 HISTORY — DX: Unspecified hereditary corneal dystrophies, unspecified eye: H18.509

## 2020-12-25 LAB — COMPREHENSIVE METABOLIC PANEL
ALT: 17 U/L (ref 0–44)
AST: 23 U/L (ref 15–41)
Albumin: 3.9 g/dL (ref 3.5–5.0)
Alkaline Phosphatase: 49 U/L (ref 38–126)
Anion gap: 6 (ref 5–15)
BUN: 12 mg/dL (ref 6–20)
CO2: 26 mmol/L (ref 22–32)
Calcium: 9.2 mg/dL (ref 8.9–10.3)
Chloride: 108 mmol/L (ref 98–111)
Creatinine, Ser: 0.77 mg/dL (ref 0.44–1.00)
GFR, Estimated: >60 mL/min (ref >60)
Glucose, Bld: 182 mg/dL — ABNORMAL HIGH (ref 70–99)
Potassium: 3.8 mmol/L (ref 3.5–5.1)
Sodium: 140 mmol/L (ref 135–145)
Total Bilirubin: 0.6 mg/dL (ref 0.3–1.2)
Total Protein: 7.1 g/dL (ref 6.5–8.1)

## 2020-12-25 LAB — URINE DRUG SCREEN, QUALITATIVE (ARMC ONLY)
Amphetamines, Ur Screen: NOT DETECTED
Barbiturates, Ur Screen: NOT DETECTED
Benzodiazepine, Ur Scrn: NOT DETECTED
Cannabinoid 50 Ng, Ur ~~LOC~~: POSITIVE — AB
Cocaine Metabolite,Ur ~~LOC~~: NOT DETECTED
MDMA (Ecstasy)Ur Screen: NOT DETECTED
Methadone Scn, Ur: NOT DETECTED
Opiate, Ur Screen: NOT DETECTED
Phencyclidine (PCP) Ur S: NOT DETECTED
Tricyclic, Ur Screen: NOT DETECTED

## 2020-12-25 LAB — PREGNANCY, URINE: Preg Test, Ur: NEGATIVE

## 2020-12-25 LAB — CBC
HCT: 42.5 % (ref 36.0–46.0)
Hemoglobin: 14.2 g/dL (ref 12.0–15.0)
MCH: 29 pg (ref 26.0–34.0)
MCHC: 33.4 g/dL (ref 30.0–36.0)
MCV: 86.9 fL (ref 80.0–100.0)
Platelets: 312 10*3/uL (ref 150–400)
RBC: 4.89 MIL/uL (ref 3.87–5.11)
RDW: 13.4 % (ref 11.5–15.5)
WBC: 8 10*3/uL (ref 4.0–10.5)
nRBC: 0 % (ref 0.0–0.2)

## 2020-12-25 NOTE — ED Notes (Signed)
Pt brought back in my family member

## 2020-12-25 NOTE — ED Triage Notes (Signed)
Pt via EMS from home. Pt is accompanied by son. Per son, pt texted him and said that he needed to come to the house because something is going on. Pt states she cannot remember why she called EMS. See first nurse note. During pt is A&Ox4 and NAD. Pt is slow to answer questions but answering appropriately.

## 2020-12-25 NOTE — ED Triage Notes (Signed)
Pt comes ems from home with possible seizure. Hx of same. Hasn't had one in awhile. Was postictal with dispatch but then when EMS arrived doesn't remember calling or what they're doing there. VSS. Cleared for triage by Dr Katrinka Blazing.

## 2020-12-25 NOTE — ED Provider Notes (Signed)
[facility]  ____________________________________________   Event Date/Time   First MD Initiated Contact with Patient 12/25/20 2051     (approximate)  I have reviewed the triage vital signs and the nursing notes.   HISTORY  Chief Complaint Altered Mental Status    HPI Danielle Tran is a 45 y.o. 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  Patient's significant other is with her and says that around 2 PM she called him seeming very afraid like something bad was going to happen.  She then called 911.  She does not recall any of this.  Patient has some intermittent memories throughout the day like she remembers making breakfast, remembers the drive over in the ambulance and when questioned does remember calling him.  Her boyfriend has a year of CBD Gummies that were on the counter and she does note that she tried these because she thought that they were candy.  She does not normally take CBD.  Patient does note that she has had increasing headaches of the past several weeks as well as blurry vision.  She is scheduled for a corneal transplant in September with ophthalmology.  Was recently seen by ophthalmology who recommended she follow-up with neurology as she has not seen them in a year.  Patient tells me that she has had multiple lumbar punctures in the past and is on medications for pseudotumor.  Has not had a lumbar puncture in 6 months.  Usually these are scheduled.  She denies any new numbness weakness.  Denies fevers chills.         Past Medical History:  Diagnosis Date   Back pain    Corneal dystrophy    Headache    Hypertension     Patient Active Problem List   Diagnosis Date Noted   Intractable headache 07/18/2015    Past Surgical History:  Procedure Laterality Date   ABDOMINAL HYSTERECTOMY     BACK SURGERY     REDUCTION MAMMAPLASTY Bilateral 1994    Prior to  Admission medications   Medication Sig Start Date End Date Taking? Authorizing Provider  acetaZOLAMIDE (DIAMOX) 250 MG tablet Take 2 tablets (500 mg total) by mouth 3 (three) times daily. 07/21/15   Katha Hamming, MD  acetaZOLAMIDE (DIAMOX) 500 MG capsule Take 500 mg by mouth every evening.    [provider]  atorvastatin (LIPITOR) 10 MG tablet Take 10 mg by mouth daily at 6 PM.    [provider]  baclofen (LIORESAL) 10 MG tablet Take 10 mg by mouth.    [provider]  busPIRone (BUSPAR) 5 MG tablet Take 5 mg by mouth 2 (two) times daily.    [provider]  Cholecalciferol (VITAMIN D-1000 MAX ST) 1000 UNITS tablet Take 1,000 Units by mouth daily.     [provider]  escitalopram (LEXAPRO) 10 MG tablet Take 10 mg by mouth daily.    [provider]  gabapentin (NEURONTIN) 300 MG capsule Take 300 mg by mouth 2 (two) times daily.  03/07/14   [provider]  ibuprofen (ADVIL,MOTRIN) 200 MG tablet Take 200 mg by mouth every 6 (six) hours as needed.    [provider]  lisinopril (PRINIVIL,ZESTRIL) 10 MG tablet Take 10 mg by mouth daily.    [provider]  meloxicam (MOBIC) 15 MG tablet Take 1 tablet (15 mg total) by mouth daily. 11/16/20   Cuthriell, Delorise Royals, PA-C  methocarbamol (  ROBAXIN) 500 MG tablet Take 1 tablet (500 mg total) by mouth 4 (four) times daily. 11/16/20   Cuthriell, Delorise Royals, PA-C  nortriptyline (PAMELOR) 10 MG capsule Take 20 mg by mouth at bedtime.    [provider]  oxyCODONE-acetaminophen (PERCOCET/ROXICET) 5-325 MG per tablet Take 1 tablet by mouth every 12 (twelve) hours.  03/07/14   [provider]  predniSONE (DELTASONE) 10 MG tablet Take 1 tablet (10 mg total) by mouth as directed. 11/16/20   Cuthriell, Delorise Royals, PA-C  rOPINIRole (REQUIP) 0.5 MG tablet Take 0.5 mg by mouth at bedtime.    [provider]  traMADol (ULTRAM) 50 MG tablet Take 1 tablet (50 mg  total) by mouth 2 (two) times daily. 07/21/15   Katha Hamming, MD  vitamin B-12 (CYANOCOBALAMIN) 100 MCG tablet Take 100 mcg by mouth daily.    [provider]    Allergies Excedrin extra strength [aspirin-acetaminophen-caffeine], Maprotiline, Trazodone, Guaifenesin, Hydrocodone, and Morphine  Family History  Problem Relation Age of Onset   Diabetes Mellitus II Father    Hypertension Father     Social History Social History   Tobacco Use   Smoking status: Former    Types: Cigarettes    Quit date: 08/02/2011    Years since quitting: 9.4   Smokeless tobacco: Never  Vaping Use   Vaping Use: Never used  Substance Use Topics   Alcohol use: Yes    Alcohol/week: 0.0 standard drinks    Comment: rarely   Drug use: Not Currently    Review of Systems   Review of Systems  Constitutional:  Negative for chills and fever.  Eyes:  Positive for visual disturbance.  Respiratory:  Negative for shortness of breath.   Cardiovascular:  Negative for chest pain.  Gastrointestinal:  Negative for abdominal pain, nausea and vomiting.  Neurological:  Positive for headaches. Negative for weakness and numbness.  Psychiatric/Behavioral:  Positive for confusion.   All other systems reviewed and are negative.  Physical Exam Updated Vital Signs BP 109/60   Pulse 73   Temp 98.1 F (36.7 C) (Oral)   Resp 16   Ht 5\' 4"  (1.626 m)   Wt 83.9 kg   SpO2 98%   BMI 31.76 kg/m   Physical Exam Vitals and nursing note reviewed.  Constitutional:      General: She is not in acute distress.    Appearance: Normal appearance.  HENT:     Head: Normocephalic and atraumatic.  Eyes:     General: No scleral icterus.    Conjunctiva/sclera: Conjunctivae normal.  Pulmonary:     Effort: Pulmonary effort is normal. No respiratory distress.     Breath sounds: No stridor.  Musculoskeletal:        General: No deformity or signs of injury.     Cervical back: Normal range of motion.  Skin:     General: Skin is warm and dry.     Coloration: Skin is not jaundiced or pale.  Neurological:     General: No focal deficit present.     Mental Status: She is alert and oriented to person, place, and time. Mental status is at baseline.     Comments: Patient is alert and oriented x3 Right pupil equal round reactive, left with opacity Extraocular movements intact Face is symmetric 5 out of 5 strength in the bilateral upper and lower extremities Sensation grossly intact in the bilateral upper and lower extremities  Psychiatric:        Behavior: Behavior  normal.     Comments: Anxious appearing, tearful     LABS (all labs ordered are listed, but only abnormal results are displayed)  Labs Reviewed  COMPREHENSIVE METABOLIC PANEL - Abnormal; Notable for the following components:      Result Value   Glucose, Bld 182 (*)    All other components within normal limits  URINE DRUG SCREEN, QUALITATIVE ([facility] ONLY) - Abnormal; Notable for the following components:   Cannabinoid 50 Ng, Ur [place] POSITIVE (*)    All other components within normal limits  CBC  PREGNANCY, URINE  CBG MONITORING, ED   ____________________________________________  EKG  Sinus tachycardia, normal axis, normal intervals, no acute ischemic changes ____________________________________________  RADIOLOGY I, Randol Kern, personally viewed and evaluated these images (plain radiographs) as part of my medical decision making, as well as reviewing the written report by the radiologist.  ED MD interpretation:  CT rerviewd by me and read by rads and no acute abnormality     ____________________________________________   PROCEDURES  Procedure(s) performed (including Critical Care):  Procedures   ____________________________________________   INITIAL IMPRESSION / ASSESSMENT AND PLAN / ED COURSE     45 year old female presents with altered mental status, difficulty remembering events throughout the day.   Presentation somewhat unusual and that she called her boyfriend who was apparently afraid on the phone and then called 911 does not recall the reason.  Memory from today is spotty.  She is alert and oriented mom my exam and her neurologic exam is normal.  Patient did try a CBD gummy which was on the counter and she thought it was candy which is the most likely explanation for her presentation.  We will send a UDS.  She does have a history of pseudotumor with increasing headaches over the past several weeks however I have low suspicion that this is contributing to her presentation today.  She has neurology follow-up and should be seen and determine if she needs to have another lumbar puncture she had these in the past however do not feel that this is needed today.  We will obtain a CT head and UDS and reassess.  UDS is positive for cannabinoids, CT wnl. On re-evaluation pt feeling improved. I suspect that this was drug related. Will dc. I advised her to f.u with her Neurologist regarding her pseudotumor.    ____________________________________________   FINAL CLINICAL IMPRESSION(S) / ED DIAGNOSES  Final diagnoses:  Marijuana intoxication, uncomplicated [facility])     ED Discharge Orders     None        Note:  This document was prepared using Dragon voice recognition software and may include unintentional dictation errors.    Georga Hacking, MD 12/25/20 [phone]

## 2020-12-25 NOTE — ED Notes (Addendum)
Pt noted to be rolled by family member

## 2020-12-25 NOTE — ED Notes (Signed)
Pt noted to be in lobby checking phone. Pt has visitor with her due to confusion.

## 2021-01-27 ENCOUNTER — Other Ambulatory Visit: Payer: Self-pay | Admitting: Student

## 2021-01-27 DIAGNOSIS — G932 Benign intracranial hypertension: Secondary | ICD-10-CM

## 2021-01-31 ENCOUNTER — Other Ambulatory Visit: Payer: Self-pay

## 2021-01-31 ENCOUNTER — Ambulatory Visit
Admission: RE | Admit: 2021-01-31 | Discharge: 2021-01-31 | Disposition: A | Discharge status: Home / Self Care | Payer: No Typology Code available for payment source | Source: Ambulatory Visit | Attending: Student | Admitting: Student

## 2021-01-31 DIAGNOSIS — G932 Benign intracranial hypertension: Secondary | ICD-10-CM | POA: Diagnosis not present

## 2021-02-12 NOTE — Progress Notes (Signed)
Patient on schedule for LP 02/14/2021, called and spoke with pre procedure instructions given. Will have driver post procedure/recovery/discharge.

## 2021-02-14 ENCOUNTER — Ambulatory Visit
Admission: RE | Admit: 2021-02-14 | Discharge: 2021-02-14 | Disposition: A | Discharge status: Home / Self Care | Payer: No Typology Code available for payment source | Source: Ambulatory Visit | Attending: Student | Admitting: Student

## 2021-02-14 ENCOUNTER — Other Ambulatory Visit: Payer: Self-pay

## 2021-02-14 DIAGNOSIS — G932 Benign intracranial hypertension: Secondary | ICD-10-CM | POA: Diagnosis not present

## 2021-02-14 LAB — CSF CELL COUNT WITH DIFFERENTIAL
Eosinophils, CSF: 0 %
Lymphs, CSF: 89 %
Monocyte-Macrophage-Spinal Fluid: 11 %
RBC Count, CSF: 19 /mm3 — ABNORMAL HIGH (ref 0–3)
Segmented Neutrophils-CSF: 0 %
WBC, CSF: 3 /mm3 (ref 0–5)

## 2021-02-14 LAB — PROTEIN, CSF: Total  Protein, CSF: 32 mg/dL (ref 15–45)

## 2021-02-14 LAB — GLUCOSE, CSF: Glucose, CSF: 78 mg/dL — ABNORMAL HIGH (ref 40–70)

## 2021-02-14 MED ORDER — LIDOCAINE HCL (PF) 1 % IJ SOLN
10.0000 mL | Freq: Once | INTRAMUSCULAR | Status: AC
  Administered 2021-02-14: 5 mL
  Filled 2021-02-14: qty 10

## 2021-02-14 NOTE — Procedures (Signed)
Successful therapeutic and diagnostic lumbar puncture. Approximately 18 cc of clear CSF was collected and sent to the laboratory for analysis. Opening pressure was 28, closing pressure was 15.   Caprice Renshaw, MD

## 2021-02-18 LAB — IGG CSF INDEX
Albumin CSF-mCnc: 15 mg/dL (ref 8–37)
Albumin: 3.9 g/dL (ref 3.8–4.8)
CSF IgG Index: 0.5 (ref 0.0–0.7)
IgG (Immunoglobin G), Serum: 870 mg/dL (ref 586–1602)
IgG, CSF: 1.6 mg/dL (ref 0.0–6.7)
IgG/Alb Ratio, CSF: 0.11 (ref 0.00–0.25)

## 2021-02-18 LAB — ANGIOTENSIN CONVERTING ENZYME, CSF: Angio Convert Enzyme: <1.5 U/L (ref 0.0–2.5)

## 2021-02-19 LAB — OLIGOCLONAL BANDS, CSF + SERM

## 2021-05-30 DIAGNOSIS — Z0000 Encounter for general adult medical examination without abnormal findings: Secondary | ICD-10-CM | POA: Diagnosis not present

## 2021-05-30 DIAGNOSIS — R69 Illness, unspecified: Secondary | ICD-10-CM | POA: Diagnosis not present

## 2021-05-30 DIAGNOSIS — Z6832 Body mass index (BMI) 32.0-32.9, adult: Secondary | ICD-10-CM | POA: Diagnosis not present

## 2021-05-30 DIAGNOSIS — E782 Mixed hyperlipidemia: Secondary | ICD-10-CM | POA: Diagnosis not present

## 2021-05-30 DIAGNOSIS — R7303 Prediabetes: Secondary | ICD-10-CM | POA: Diagnosis not present

## 2021-05-30 DIAGNOSIS — G932 Benign intracranial hypertension: Secondary | ICD-10-CM | POA: Diagnosis not present

## 2021-05-30 DIAGNOSIS — I10 Essential (primary) hypertension: Secondary | ICD-10-CM | POA: Diagnosis not present

## 2021-05-30 DIAGNOSIS — R519 Headache, unspecified: Secondary | ICD-10-CM | POA: Diagnosis not present

## 2021-05-30 DIAGNOSIS — G5793 Unspecified mononeuropathy of bilateral lower limbs: Secondary | ICD-10-CM | POA: Diagnosis not present

## 2021-05-30 DIAGNOSIS — E1165 Type 2 diabetes mellitus with hyperglycemia: Secondary | ICD-10-CM | POA: Diagnosis not present

## 2021-06-02 DIAGNOSIS — Z6832 Body mass index (BMI) 32.0-32.9, adult: Secondary | ICD-10-CM | POA: Diagnosis not present

## 2021-06-02 DIAGNOSIS — G5793 Unspecified mononeuropathy of bilateral lower limbs: Secondary | ICD-10-CM | POA: Diagnosis not present

## 2021-06-02 DIAGNOSIS — E782 Mixed hyperlipidemia: Secondary | ICD-10-CM | POA: Diagnosis not present

## 2021-06-02 DIAGNOSIS — M961 Postlaminectomy syndrome, not elsewhere classified: Secondary | ICD-10-CM | POA: Diagnosis not present

## 2021-06-02 DIAGNOSIS — I10 Essential (primary) hypertension: Secondary | ICD-10-CM | POA: Diagnosis not present

## 2021-06-02 DIAGNOSIS — G932 Benign intracranial hypertension: Secondary | ICD-10-CM | POA: Diagnosis not present

## 2021-06-02 DIAGNOSIS — H538 Other visual disturbances: Secondary | ICD-10-CM | POA: Diagnosis not present

## 2021-08-12 DIAGNOSIS — T868419 Corneal transplant failure, unspecified eye: Secondary | ICD-10-CM | POA: Diagnosis not present

## 2022-01-13 ENCOUNTER — Encounter
Admission: RE | Disposition: A | Discharge status: Home / Self Care | Payer: Self-pay | Source: Home / Self Care | Attending: Gastroenterology

## 2022-01-13 ENCOUNTER — Other Ambulatory Visit: Payer: Self-pay

## 2022-01-13 ENCOUNTER — Ambulatory Visit: Payer: 59 | Admitting: Anesthesiology

## 2022-01-13 ENCOUNTER — Encounter: Payer: Self-pay | Admitting: *Deleted

## 2022-01-13 ENCOUNTER — Ambulatory Visit
Admission: RE | Admit: 2022-01-13 | Discharge: 2022-01-13 | Disposition: A | Discharge status: Home / Self Care | Payer: 59 | Attending: Gastroenterology | Admitting: Gastroenterology

## 2022-01-13 DIAGNOSIS — K449 Diaphragmatic hernia without obstruction or gangrene: Secondary | ICD-10-CM | POA: Insufficient documentation

## 2022-01-13 DIAGNOSIS — Z79899 Other long term (current) drug therapy: Secondary | ICD-10-CM | POA: Insufficient documentation

## 2022-01-13 DIAGNOSIS — M1990 Unspecified osteoarthritis, unspecified site: Secondary | ICD-10-CM | POA: Diagnosis not present

## 2022-01-13 DIAGNOSIS — K2100 Gastro-esophageal reflux disease with esophagitis, without bleeding: Secondary | ICD-10-CM | POA: Insufficient documentation

## 2022-01-13 DIAGNOSIS — G709 Myoneural disorder, unspecified: Secondary | ICD-10-CM | POA: Diagnosis not present

## 2022-01-13 DIAGNOSIS — R1310 Dysphagia, unspecified: Secondary | ICD-10-CM | POA: Diagnosis present

## 2022-01-13 DIAGNOSIS — Z1211 Encounter for screening for malignant neoplasm of colon: Secondary | ICD-10-CM | POA: Insufficient documentation

## 2022-01-13 DIAGNOSIS — K640 First degree hemorrhoids: Secondary | ICD-10-CM | POA: Insufficient documentation

## 2022-01-13 DIAGNOSIS — I10 Essential (primary) hypertension: Secondary | ICD-10-CM | POA: Diagnosis not present

## 2022-01-13 DIAGNOSIS — Z87891 Personal history of nicotine dependence: Secondary | ICD-10-CM | POA: Diagnosis not present

## 2022-01-13 DIAGNOSIS — K2950 Unspecified chronic gastritis without bleeding: Secondary | ICD-10-CM | POA: Insufficient documentation

## 2022-01-13 DIAGNOSIS — K2210 Ulcer of esophagus without bleeding: Secondary | ICD-10-CM | POA: Diagnosis not present

## 2022-01-13 DIAGNOSIS — G8929 Other chronic pain: Secondary | ICD-10-CM | POA: Insufficient documentation

## 2022-01-13 HISTORY — DX: Gastro-esophageal reflux disease without esophagitis: K21.9

## 2022-01-13 HISTORY — PX: ESOPHAGOGASTRODUODENOSCOPY: SHX5428

## 2022-01-13 HISTORY — DX: Unspecified osteoarthritis, unspecified site: M19.90

## 2022-01-13 HISTORY — PX: COLONOSCOPY: SHX5424

## 2022-01-13 SURGERY — COLONOSCOPY
Anesthesia: General

## 2022-01-13 MED ORDER — MIDAZOLAM HCL 2 MG/2ML IJ SOLN
INTRAMUSCULAR | Status: AC
  Filled 2022-01-13: qty 2

## 2022-01-13 MED ORDER — ARTIFICIAL TEARS OPHTHALMIC OINT
TOPICAL_OINTMENT | OPHTHALMIC | Status: AC
  Filled 2022-01-13: qty 7

## 2022-01-13 MED ORDER — LIDOCAINE HCL (CARDIAC) PF 100 MG/5ML IV SOSY
PREFILLED_SYRINGE | INTRAVENOUS | Status: DC | PRN
  Administered 2022-01-13: 100 mg via INTRAVENOUS

## 2022-01-13 MED ORDER — PROPOFOL 10 MG/ML IV BOLUS
INTRAVENOUS | Status: AC
  Filled 2022-01-13: qty 20

## 2022-01-13 MED ORDER — PROPOFOL 500 MG/50ML IV EMUL
INTRAVENOUS | Status: DC | PRN
  Administered 2022-01-13: 50 ug/kg/min via INTRAVENOUS
  Administered 2022-01-13: 150 ug/kg/min via INTRAVENOUS

## 2022-01-13 MED ORDER — PROPOFOL 10 MG/ML IV BOLUS
INTRAVENOUS | Status: DC | PRN
  Administered 2022-01-13: 50 mg via INTRAVENOUS

## 2022-01-13 MED ORDER — HYDRALAZINE HCL 20 MG/ML IJ SOLN
INTRAMUSCULAR | Status: AC
  Filled 2022-01-13: qty 1

## 2022-01-13 MED ORDER — SODIUM CHLORIDE 0.9 % IV SOLN
INTRAVENOUS | Status: DC
  Administered 2022-01-13: 50 mL via INTRAVENOUS

## 2022-01-13 MED ORDER — LIDOCAINE HCL (PF) 2 % IJ SOLN
INTRAMUSCULAR | Status: AC
  Filled 2022-01-13: qty 5

## 2022-01-13 MED ORDER — MIDAZOLAM HCL 2 MG/2ML IJ SOLN
INTRAMUSCULAR | Status: DC | PRN
  Administered 2022-01-13 (×2): 1 mg via INTRAVENOUS

## 2022-01-13 NOTE — Op Note (Signed)
[facility] Gastroenterology Patient Name: Danielle Tran Procedure Date: 01/13/2022 1:26 PM MRN: [id number] Account #: [id number] Date of Birth: December 03, 1975 Admit Type: Outpatient Age: 46 Room: [facility] ENDO ROOM 1 Gender: Female Note Status: Finalized Instrument Name: Upper Endoscope [id number] Procedure:             Upper GI endoscopy Indications:           Dysphagia Providers:             Andrey Farmer MD, MD Referring MD:          Tracie Harrier, MD (Referring MD) Medicines:             Monitored Anesthesia Care Complications:         No immediate complications. Estimated blood loss:                         Minimal. Procedure:             Pre-Anesthesia Assessment:                        - Prior to the procedure, a History and Physical was                         performed, and patient medications and allergies were                         reviewed. The patient is competent. The risks and                         benefits of the procedure and the sedation options and                         risks were discussed with the patient. All questions                         were answered and informed consent was obtained.                         Patient identification and proposed procedure were                         verified by the physician, the nurse, the                         anesthesiologist, the anesthetist and the technician                         in the endoscopy suite. Mental Status Examination:                         alert and oriented. Airway Examination: normal                         oropharyngeal airway and neck mobility. Respiratory                         Examination: clear to auscultation. CV Examination:  normal. Prophylactic Antibiotics: The patient does not                         require prophylactic antibiotics. Prior                         Anticoagulants: The patient has taken no previous                          anticoagulant or antiplatelet agents. ASA Grade                         Assessment: II - A patient with mild systemic disease.                         After reviewing the risks and benefits, the patient                         was deemed in satisfactory condition to undergo the                         procedure. The anesthesia plan was to use monitored                         anesthesia care (MAC). Immediately prior to                         administration of medications, the patient was                         re-assessed for adequacy to receive sedatives. The                         heart rate, respiratory rate, oxygen saturations,                         blood pressure, adequacy of pulmonary ventilation, and                         response to care were monitored throughout the                         procedure. The physical status of the patient was                         re-assessed after the procedure.                        After obtaining informed consent, the endoscope was                         passed under direct vision. Throughout the procedure,                         the patient's blood pressure, pulse, and oxygen                         saturations were monitored continuously. The Endoscope  was introduced through the mouth, and advanced to the                         second part of duodenum. The upper GI endoscopy was                         somewhat difficult due to the patient's oxygen                         desaturation. Successful completion of the procedure                         was aided by managing the patient's medical                         instability. The patient tolerated the procedure well. Findings:      LA Grade C (one or more mucosal breaks continuous between tops of 2 or       more mucosal folds, less than 75% circumference) esophagitis with no       bleeding was found. Biopsies were taken with a cold forceps for        histology. Estimated blood loss was minimal.      A 4 cm hiatal hernia was present.      Diffuse granular mucosa was found in the entire examined stomach.       Biopsies were taken with a cold forceps for histology. Estimated blood       loss was minimal.      The examined duodenum was normal. Impression:            - LA Grade C reflux esophagitis with no bleeding.                         Biopsied.                        - 4 cm hiatal hernia.                        - Granular gastric mucosa. Biopsied.                        - Normal examined duodenum. Recommendation:        - Discharge patient to home.                        - Resume previous diet.                        - Use a proton pump inhibitor PO daily.                        - Repeat upper endoscopy in 3 months to check healing.                        - Return to referring physician as previously                         scheduled. Procedure Code(s):     --- Professional ---  43606, Esophagogastroduodenoscopy, flexible,                         transoral; with biopsy, single or multiple Diagnosis Code(s):     --- Professional ---                        K21.00, Gastro-esophageal reflux disease with                         esophagitis, without bleeding                        K44.9, Diaphragmatic hernia without obstruction or                         gangrene                        K31.89, Other diseases of stomach and duodenum                        R13.10, Dysphagia, unspecified CPT copyright 2019 American Medical Association. All rights reserved. The codes documented in this report are preliminary and upon coder review may  be revised to meet current compliance requirements. Andrey Farmer MD, MD 01/13/2022 2:09:13 PM Number of Addenda: 0 Note Initiated On: 01/13/2022 1:26 PM Estimated Blood Loss:  Estimated blood loss was minimal.      [facility]

## 2022-01-13 NOTE — Anesthesia Postprocedure Evaluation (Signed)
Anesthesia Post Note  Patient: Danielle Tran  Procedure(s) Performed: COLONOSCOPY ESOPHAGOGASTRODUODENOSCOPY (EGD)  Patient location during evaluation: Endoscopy Anesthesia Type: General Level of consciousness: awake and alert Pain management: pain level controlled Vital Signs Assessment: post-procedure vital signs reviewed and stable Respiratory status: spontaneous breathing, nonlabored ventilation, respiratory function stable and patient connected to nasal cannula oxygen Cardiovascular status: blood pressure returned to baseline and stable Postop Assessment: no apparent nausea or vomiting Anesthetic complications: no   No notable events documented.   Last Vitals:  Vitals:   01/13/22 1408 01/13/22 1418  BP: 104/62 109/66  Pulse:    Resp:    Temp: (!) 35.9 C   SpO2: 97%     Last Pain:  Vitals:   01/13/22 1428  TempSrc:   PainSc: 0-No pain                 Ilene Qua

## 2022-01-13 NOTE — Op Note (Signed)
[facility] Gastroenterology Patient Name: Danielle Tran Procedure Date: 01/13/2022 1:25 PM MRN: [id number] Account #: [id number] Date of Birth: 12-05-1975 Admit Type: Outpatient Age: 46 Room: [facility] ENDO ROOM 1 Gender: Female Note Status: Finalized Instrument Name: Jasper Riling [id number] Procedure:             Colonoscopy Indications:           Screening for colorectal malignant neoplasm Providers:             Andrey Farmer MD, MD Referring MD:          Tracie Harrier, MD (Referring MD) Medicines:             Monitored Anesthesia Care Complications:         No immediate complications. Procedure:             Pre-Anesthesia Assessment:                        - Prior to the procedure, a History and Physical was                         performed, and patient medications and allergies were                         reviewed. The patient is competent. The risks and                         benefits of the procedure and the sedation options and                         risks were discussed with the patient. All questions                         were answered and informed consent was obtained.                         Patient identification and proposed procedure were                         verified by the physician, the nurse, the                         anesthesiologist, the anesthetist and the technician                         in the endoscopy suite. Mental Status Examination:                         alert and oriented. Airway Examination: normal                         oropharyngeal airway and neck mobility. Respiratory                         Examination: clear to auscultation. CV Examination:                         normal. Prophylactic Antibiotics: The patient does not  require prophylactic antibiotics. Prior                         Anticoagulants: The patient has taken no previous                         anticoagulant or antiplatelet agents.  ASA Grade                         Assessment: II - A patient with mild systemic disease.                         After reviewing the risks and benefits, the patient                         was deemed in satisfactory condition to undergo the                         procedure. The anesthesia plan was to use monitored                         anesthesia care (MAC). Immediately prior to                         administration of medications, the patient was                         re-assessed for adequacy to receive sedatives. The                         heart rate, respiratory rate, oxygen saturations,                         blood pressure, adequacy of pulmonary ventilation, and                         response to care were monitored throughout the                         procedure. The physical status of the patient was                         re-assessed after the procedure.                        After obtaining informed consent, the colonoscope was                         passed under direct vision. Throughout the procedure,                         the patient's blood pressure, pulse, and oxygen                         saturations were monitored continuously. The                         Colonoscope was introduced through the anus and  advanced to the the terminal ileum. The colonoscopy                         was performed without difficulty. The patient                         tolerated the procedure well. The quality of the bowel                         preparation was good. Findings:      The perianal and digital rectal examinations were normal.      The terminal ileum appeared normal.      Internal hemorrhoids were found during retroflexion. The hemorrhoids       were Grade I (internal hemorrhoids that do not prolapse).      The exam was otherwise without abnormality on direct and retroflexion       views. Impression:            - The examined portion of the  ileum was normal.                        - Internal hemorrhoids.                        - The examination was otherwise normal on direct and                         retroflexion views.                        - No specimens collected. Recommendation:        - Discharge patient to home.                        - Resume previous diet.                        - Continue present medications.                        - Repeat colonoscopy in 10 years for screening                         purposes.                        - Return to referring physician as previously                         scheduled. Procedure Code(s):     --- Professional ---                        P5945, Colorectal cancer screening; colonoscopy on                         individual not meeting criteria for high risk Diagnosis Code(s):     --- Professional ---                        Z12.11, Encounter for screening for malignant neoplasm  of colon                        K64.0, First degree hemorrhoids CPT copyright 2019 American Medical Association. All rights reserved. The codes documented in this report are preliminary and upon coder review may  be revised to meet current compliance requirements. Andrey Farmer MD, MD 01/13/2022 2:11:13 PM Number of Addenda: 0 Note Initiated On: 01/13/2022 1:25 PM Scope Withdrawal Time: 0 hours 8 minutes 6 seconds  Total Procedure Duration: 0 hours 13 minutes 55 seconds  Estimated Blood Loss:  Estimated blood loss: none.      [facility]

## 2022-01-13 NOTE — Transfer of Care (Signed)
Immediate Anesthesia Transfer of Care Note  Patient: Danielle Tran  Procedure(s) Performed: COLONOSCOPY ESOPHAGOGASTRODUODENOSCOPY (EGD)  Patient Location: PACU  Anesthesia Type:General  Level of Consciousness: sedated  Airway & Oxygen Therapy: Patient Spontanous Breathing and Patient connected to nasal cannula oxygen  Post-op Assessment: Report given to RN and Post -op Vital signs reviewed and stable  Post vital signs: Reviewed and stable  Last Vitals:  Vitals Value Taken Time  BP 104/62 01/13/22 1408  Temp 35.9 C 01/13/22 1408  Pulse 91 01/13/22 1409  Resp 15 01/13/22 1409  SpO2 98 % 01/13/22 1409  Vitals shown include unvalidated device data.  Last Pain:  Vitals:   01/13/22 1408  TempSrc: Temporal  PainSc:          Complications: No notable events documented.

## 2022-01-13 NOTE — H&P (Signed)
Outpatient short stay form Pre-procedure 01/13/2022  Danielle Bill, MD  Primary Physician: Barbette Reichmann, MD  Reason for visit:  Dysphagia/Screening colonoscopy  History of present illness:    46 y/o lady with history of chronic pain here for EGD for solid food dysphagia and colonoscopy for screening. No blood thinners. No family history of GI malignancies. No significant neck or abdominal surgeries.    Current Facility-Administered Medications:    0.9 %  sodium chloride infusion, , Intravenous, Continuous, Everly Rubalcava, Rossie Muskrat, MD, Last Rate: 20 mL/hr at 01/13/22 1320, 50 mL at 01/13/22 1320  Medications Prior to Admission  Medication Sig Dispense Refill Last Dose   acetaZOLAMIDE (DIAMOX) 500 MG capsule Take 500 mg by mouth every evening.   Past Week   atorvastatin (LIPITOR) 10 MG tablet Take 10 mg by mouth daily at 6 PM.   Past Week   baclofen (LIORESAL) 10 MG tablet Take 10 mg by mouth.   Past Week   busPIRone (BUSPAR) 5 MG tablet Take 5 mg by mouth 2 (two) times daily.   Past Week   Cholecalciferol 25 MCG (1000 UT) tablet Take 1,000 Units by mouth daily.    Past Week   escitalopram (LEXAPRO) 10 MG tablet Take 10 mg by mouth daily.   Past Week   lisinopril (PRINIVIL,ZESTRIL) 10 MG tablet Take 10 mg by mouth daily.   Past Week   meloxicam (MOBIC) 15 MG tablet Take 1 tablet (15 mg total) by mouth daily. 30 tablet 0 Past Week   nortriptyline (PAMELOR) 10 MG capsule Take 20 mg by mouth at bedtime.   Past Week   Prenatal Vit-Fe Fumarate-FA (PRENATAL MULTIVITAMIN) TABS tablet Take 1 tablet by mouth daily at 12 noon.      traMADol (ULTRAM) 50 MG tablet Take 1 tablet (50 mg total) by mouth 2 (two) times daily. 14 tablet 0 Past Month   vitamin B-12 (CYANOCOBALAMIN) 100 MCG tablet Take 100 mcg by mouth daily.   Past Month   acetaZOLAMIDE (DIAMOX) 250 MG tablet Take 2 tablets (500 mg total) by mouth 3 (three) times daily. 60 tablet 0    gabapentin (NEURONTIN) 300 MG capsule Take 300 mg  by mouth 2 (two) times daily.  (Patient not taking: Reported on 01/13/2022)   Not Taking   ibuprofen (ADVIL,MOTRIN) 200 MG tablet Take 200 mg by mouth every 6 (six) hours as needed.      methocarbamol (ROBAXIN) 500 MG tablet Take 1 tablet (500 mg total) by mouth 4 (four) times daily. (Patient not taking: Reported on 01/13/2022) 16 tablet 0 Not Taking   oxyCODONE-acetaminophen (PERCOCET/ROXICET) 5-325 MG per tablet Take 1 tablet by mouth every 12 (twelve) hours.  (Patient not taking: Reported on 01/13/2022)   Not Taking   predniSONE (DELTASONE) 10 MG tablet Take 1 tablet (10 mg total) by mouth as directed. (Patient not taking: Reported on 01/13/2022) 42 tablet 0 Not Taking   rOPINIRole (REQUIP) 0.5 MG tablet Take 0.5 mg by mouth at bedtime. (Patient not taking: Reported on 01/13/2022)   Not Taking     Allergies  Allergen Reactions   Excedrin Extra Strength [Aspirin-Acetaminophen-Caffeine] Other (See Comments)    Hard time breathing   Maprotiline Shortness Of Breath    Other reaction(s): ANAPHYLAXIS   Trazodone Anaphylaxis    Other reaction(s): ANAPHYLAXIS   Guaifenesin     Other reaction(s): VOMITING   Hydrocodone     Other reaction(s): NAUSEA   Morphine     Other reaction(s): VOMITING  Past Medical History:  Diagnosis Date   Arthritis    Back pain    Corneal dystrophy    GERD (gastroesophageal reflux disease)    Headache    Hypertension     Review of systems:  Otherwise negative.    Physical Exam  Gen: Alert, oriented. Appears stated age.  HEENT: PERRLA. Lungs: No respiratory distress CV: RRR Abd: soft, benign, no masses Ext: No edema    Planned procedures: Proceed with EGD/colonoscopy. The patient understands the nature of the planned procedure, indications, risks, alternatives and potential complications including but not limited to bleeding, infection, perforation, damage to internal organs and possible oversedation/side effects from anesthesia. The patient agrees  and gives consent to proceed.  Please refer to procedure notes for findings, recommendations and patient disposition/instructions.     [name], MD [facility]

## 2022-01-13 NOTE — Anesthesia Preprocedure Evaluation (Signed)
Anesthesia Evaluation  Patient identified by MRN, date of birth, ID band Patient awake    Reviewed: Allergy & Precautions, NPO status , Patient's Chart, lab work & pertinent test results  Airway Mallampati: II  TM Distance: >3 FB Neck ROM: Full    Dental no notable dental hx.    Pulmonary neg pulmonary ROS, former smoker,    Pulmonary exam normal breath sounds clear to auscultation       Cardiovascular hypertension, Pt. on medications negative cardio ROS Normal cardiovascular exam Rhythm:Regular Rate:Normal     Neuro/Psych  Headaches,  Neuromuscular disease negative psych ROS   GI/Hepatic Neg liver ROS, GERD  ,  Endo/Other  negative endocrine ROS  Renal/GU negative Renal ROS  negative genitourinary   Musculoskeletal  (+) Arthritis , Osteoarthritis,  Chronic back pain   Abdominal   Peds negative pediatric ROS (+)  Hematology negative hematology ROS (+)   Anesthesia Other Findings Multiple cornea transplants   Reproductive/Obstetrics negative OB ROS                            Anesthesia Physical Anesthesia Plan  ASA: 2  Anesthesia Plan: General   Post-op Pain Management: Minimal or no pain anticipated   Induction: Intravenous  PONV Risk Score and Plan: 1 and Propofol infusion and TIVA  Airway Management Planned: Natural Airway and Nasal Cannula  Additional Equipment:   Intra-op Plan:   Post-operative Plan:   Informed Consent: I have reviewed the patients History and Physical, chart, labs and discussed the procedure including the risks, benefits and alternatives for the proposed anesthesia with the patient or authorized representative who has indicated his/her understanding and acceptance.     Dental Advisory Given  Plan Discussed with: Anesthesiologist, CRNA and Surgeon  Anesthesia Plan Comments: (Patient consented for risks of anesthesia including but not limited to:  -  adverse reactions to medications - risk of airway placement if required - damage to eyes, teeth, lips or other oral mucosa - nerve damage due to positioning  - sore throat or hoarseness - Damage to heart, brain, nerves, lungs, other parts of body or loss of life  Patient voiced understanding.)      Anesthesia Quick Evaluation

## 2022-01-13 NOTE — Interval H&P Note (Signed)
History and Physical Interval Note:  01/13/2022 1:29 PM  Danielle Tran  has presented today for surgery, with the diagnosis of Colon cancer screening (Z12.11) Pharyngoesophageal dysphagia (R13.14) Gastroesophageal reflux disease, unspecified whether esophagitis present (K21.9).  The various methods of treatment have been discussed with the patient and family. After consideration of risks, benefits and other options for treatment, the patient has consented to  Procedure(s): COLONOSCOPY (N/A) ESOPHAGOGASTRODUODENOSCOPY (EGD) (N/A) as a surgical intervention.  The patient's history has been reviewed, patient examined, no change in status, stable for surgery.  I have reviewed the patient's chart and labs.  Questions were answered to the patient's satisfaction.     [name]  Ok to proceed with EGD/Colonoscopy

## 2022-01-14 ENCOUNTER — Encounter: Payer: Self-pay | Admitting: Gastroenterology

## 2022-01-15 LAB — SURGICAL PATHOLOGY

## 2022-03-07 IMAGING — RF DG FLUORO GUIDE SPINAL/SI JT INJ*L*
1 series · 1 of 1 positions shown · non-contrast
Comparison: 05/09/2019

CLINICAL DATA: Idiopathic Intracranial Hypertension

EXAM:
DIAGNOSTIC LUMBAR PUNCTURE UNDER FLUOROSCOPIC GUIDANCE

[Series 1: cp_standard · 0.27mm/px · 1 of 1 slices shown]
[im 1/1]
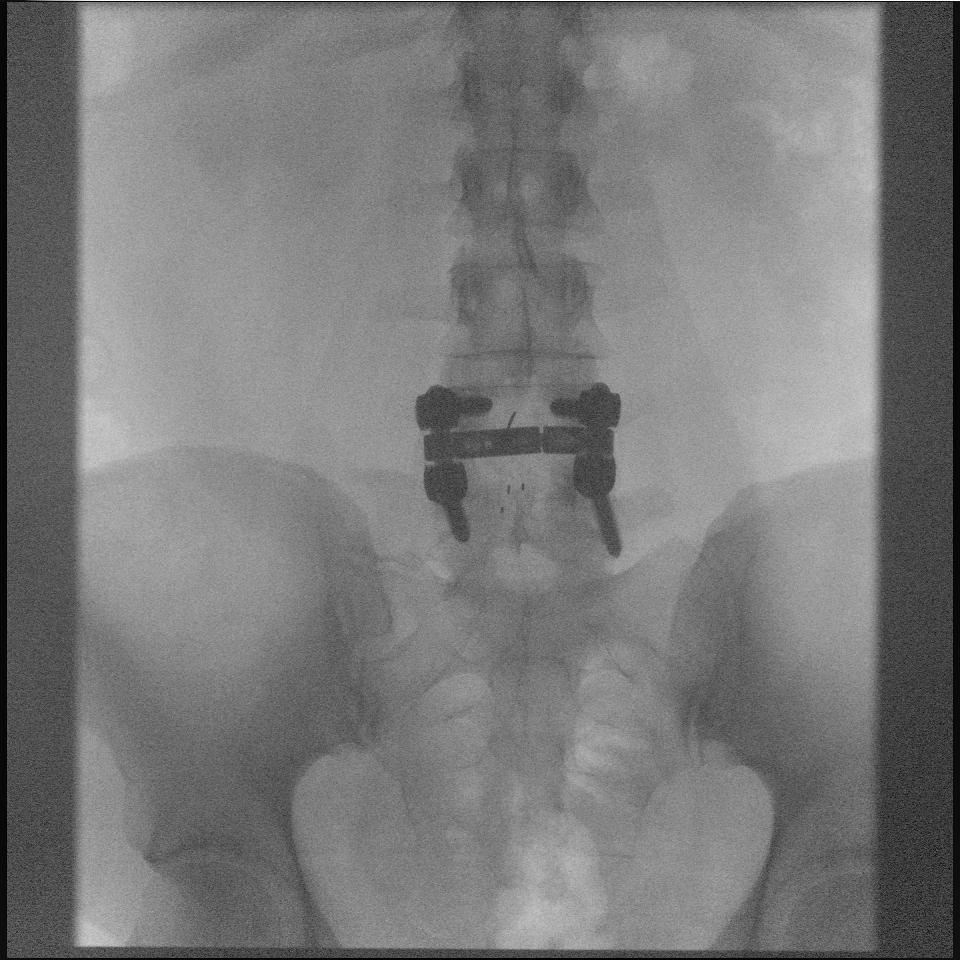

[1 of 1 positions shown; findings below may reference images not displayed]

FLUOROSCOPY TIME:  Fluoroscopy Time:  6 seconds

Radiation Exposure Index (if provided by the fluoroscopic device):
1.4 mGy

Number of Acquired Spot Images: 1

PROCEDURE:
Informed consent was obtained from the patient prior to the
procedure, including potential complications of headache, allergy,
and pain. With the patient prone, the lower back was prepped with
DuraPrep. 1% Lidocaine was used for local anesthesia. Lumbar
puncture was performed at the L3-L4 level using a 20 gauge needle
with return of clear CSF with an opening pressure of 20 cm water.
Approximately 18 ml of CSF were obtained for laboratory studies.
Closing pressure was 15 cm water. The patient tolerated the
procedure well and there were no apparent complications.
IMPRESSION: Successful diagnostic and therapeutic lumbar puncture. Approximately
18 mL of clear CSF was removed.

## 2022-04-03 ENCOUNTER — Encounter: Payer: Self-pay | Admitting: *Deleted

## 2022-04-06 ENCOUNTER — Ambulatory Visit: Admission: RE | Admit: 2022-04-06 | Payer: 59 | Source: Home / Self Care

## 2022-04-06 ENCOUNTER — Encounter: Payer: Self-pay | Admitting: Anesthesiology

## 2022-04-06 ENCOUNTER — Encounter: Admission: RE | Payer: Self-pay | Source: Home / Self Care

## 2022-04-06 SURGERY — ESOPHAGOGASTRODUODENOSCOPY (EGD) WITH PROPOFOL
Anesthesia: General

## 2022-09-01 ENCOUNTER — Ambulatory Visit
Admission: RE | Admit: 2022-09-01 | Discharge: 2022-09-01 | Disposition: A | Discharge status: Home / Self Care | Payer: 59 | Attending: Gastroenterology | Admitting: Gastroenterology

## 2022-09-01 ENCOUNTER — Encounter: Payer: Self-pay | Admitting: *Deleted

## 2022-09-01 ENCOUNTER — Ambulatory Visit: Payer: 59 | Admitting: Anesthesiology

## 2022-09-01 ENCOUNTER — Encounter
Admission: RE | Disposition: A | Discharge status: Home / Self Care | Payer: Self-pay | Source: Home / Self Care | Attending: Gastroenterology

## 2022-09-01 DIAGNOSIS — E669 Obesity, unspecified: Secondary | ICD-10-CM | POA: Diagnosis not present

## 2022-09-01 DIAGNOSIS — K2289 Other specified disease of esophagus: Secondary | ICD-10-CM | POA: Insufficient documentation

## 2022-09-01 DIAGNOSIS — M549 Dorsalgia, unspecified: Secondary | ICD-10-CM | POA: Insufficient documentation

## 2022-09-01 DIAGNOSIS — K2100 Gastro-esophageal reflux disease with esophagitis, without bleeding: Secondary | ICD-10-CM | POA: Insufficient documentation

## 2022-09-01 DIAGNOSIS — E785 Hyperlipidemia, unspecified: Secondary | ICD-10-CM | POA: Diagnosis not present

## 2022-09-01 DIAGNOSIS — I10 Essential (primary) hypertension: Secondary | ICD-10-CM | POA: Diagnosis not present

## 2022-09-01 DIAGNOSIS — K2950 Unspecified chronic gastritis without bleeding: Secondary | ICD-10-CM | POA: Diagnosis not present

## 2022-09-01 DIAGNOSIS — Z87891 Personal history of nicotine dependence: Secondary | ICD-10-CM | POA: Insufficient documentation

## 2022-09-01 DIAGNOSIS — K449 Diaphragmatic hernia without obstruction or gangrene: Secondary | ICD-10-CM | POA: Insufficient documentation

## 2022-09-01 DIAGNOSIS — R1310 Dysphagia, unspecified: Secondary | ICD-10-CM | POA: Diagnosis present

## 2022-09-01 DIAGNOSIS — Z79899 Other long term (current) drug therapy: Secondary | ICD-10-CM | POA: Insufficient documentation

## 2022-09-01 DIAGNOSIS — Z6833 Body mass index (BMI) 33.0-33.9, adult: Secondary | ICD-10-CM | POA: Insufficient documentation

## 2022-09-01 DIAGNOSIS — G8929 Other chronic pain: Secondary | ICD-10-CM | POA: Insufficient documentation

## 2022-09-01 HISTORY — PX: ESOPHAGOGASTRODUODENOSCOPY (EGD) WITH PROPOFOL: SHX5813

## 2022-09-01 SURGERY — ESOPHAGOGASTRODUODENOSCOPY (EGD) WITH PROPOFOL
Anesthesia: General

## 2022-09-01 MED ORDER — PROPOFOL 10 MG/ML IV BOLUS
INTRAVENOUS | Status: DC | PRN
  Administered 2022-09-01: 30 mg via INTRAVENOUS
  Administered 2022-09-01: 70 mg via INTRAVENOUS

## 2022-09-01 MED ORDER — PROPOFOL 500 MG/50ML IV EMUL
INTRAVENOUS | Status: DC | PRN
  Administered 2022-09-01: 155 ug/kg/min via INTRAVENOUS

## 2022-09-01 MED ORDER — GLYCOPYRROLATE 0.2 MG/ML IJ SOLN
INTRAMUSCULAR | Status: DC | PRN
  Administered 2022-09-01: .2 mg via INTRAVENOUS

## 2022-09-01 MED ORDER — SODIUM CHLORIDE 0.9 % IV SOLN
INTRAVENOUS | Status: DC
  Administered 2022-09-01: 1000 mL via INTRAVENOUS

## 2022-09-01 MED ORDER — LIDOCAINE HCL (CARDIAC) PF 100 MG/5ML IV SOSY
PREFILLED_SYRINGE | INTRAVENOUS | Status: DC | PRN
  Administered 2022-09-01: 100 mg via INTRAVENOUS

## 2022-09-01 NOTE — Interval H&P Note (Signed)
History and Physical Interval Note:  09/01/2022 11:35 AM  Danielle Tran  has presented today for surgery, with the diagnosis of EE Dysphagia Esophagitis HH.  The various methods of treatment have been discussed with the patient and family. After consideration of risks, benefits and other options for treatment, the patient has consented to  Procedure(s): ESOPHAGOGASTRODUODENOSCOPY (EGD) WITH PROPOFOL (N/A) as a surgical intervention.  The patient's history has been reviewed, patient examined, no change in status, stable for surgery.  I have reviewed the patient's chart and labs.  Questions were answered to the patient's satisfaction.     [name]  Ok to proceed with EGD

## 2022-09-01 NOTE — Op Note (Signed)
[facility] Gastroenterology Patient Name: Danielle Tran Procedure Date: 09/01/2022 11:12 AM MRN: [id number] Account #: [id number] Date of Birth: [birth year] Admit Type: Outpatient Age: 47 Room: [facility] ENDO ROOM 1 Gender: Female Note Status: Finalized Instrument Name: Patton Salles Endoscope [id number] Procedure:             Upper GI endoscopy Indications:           Dysphagia, Esophagitis Providers:             Eather Colas MD, MD Medicines:             Monitored Anesthesia Care Complications:         No immediate complications. Estimated blood loss:                         Minimal. Procedure:             Pre-Anesthesia Assessment:                        - Prior to the procedure, a History and Physical was                         performed, and patient medications and allergies were                         reviewed. The patient is competent. The risks and                         benefits of the procedure and the sedation options and                         risks were discussed with the patient. All questions                         were answered and informed consent was obtained.                         Patient identification and proposed procedure were                         verified by the physician, the nurse, the                         anesthesiologist, the anesthetist and the technician                         in the endoscopy suite. Mental Status Examination:                         alert and oriented. Airway Examination: normal                         oropharyngeal airway and neck mobility. Respiratory                         Examination: clear to auscultation. CV Examination:                         normal. Prophylactic Antibiotics: The patient does not  require prophylactic antibiotics. Prior                         Anticoagulants: The patient has taken no anticoagulant                         or antiplatelet agents. ASA Grade Assessment: II  - A                         patient with mild systemic disease. After reviewing                         the risks and benefits, the patient was deemed in                         satisfactory condition to undergo the procedure. The                         anesthesia plan was to use monitored anesthesia care                         (MAC). Immediately prior to administration of                         medications, the patient was re-assessed for adequacy                         to receive sedatives. The heart rate, respiratory                         rate, oxygen saturations, blood pressure, adequacy of                         pulmonary ventilation, and response to care were                         monitored throughout the procedure. The physical                         status of the patient was re-assessed after the                         procedure.                        After obtaining informed consent, the endoscope was                         passed under direct vision. Throughout the procedure,                         the patient's blood pressure, pulse, and oxygen                         saturations were monitored continuously. The Endoscope                         was introduced through the mouth, and advanced to the  second part of duodenum. The upper GI endoscopy was                         accomplished without difficulty. The patient tolerated                         the procedure well. Findings:      LA Grade A (one or more mucosal breaks less than 5 mm, not extending       between tops of 2 mucosal folds) esophagitis with no bleeding was found.       Biopsies were obtained from the proximal and distal esophagus with cold       forceps for histology of suspected eosinophilic esophagitis.      A small hiatal hernia was present.      No endoscopic abnormality was evident in the esophagus to explain the       patient's complaint of dysphagia. It was decided,  however, to proceed       with dilation at the gastroesophageal junction. A TTS dilator was passed       through the scope. Dilation with a 15-16.5-18 mm balloon dilator was       performed to 18 mm. The dilation site was examined and showed mild       mucosal disruption. Estimated blood loss was minimal.      The entire examined stomach was normal. Biopsies were taken with a cold       forceps for Helicobacter pylori testing. Estimated blood loss was       minimal.      The examined duodenum was normal. Impression:            - LA Grade A reflux esophagitis with no bleeding.                        - Small hiatal hernia.                        - No endoscopic esophageal abnormality to explain                         patient's dysphagia. Esophagus dilated. Dilated.                        - Normal stomach. Biopsied.                        - Normal examined duodenum.                        - Biopsies were taken with a cold forceps for                         evaluation of eosinophilic esophagitis. Recommendation:        - Discharge patient to home.                        - Resume previous diet.                        - Continue present medications.                        -  Await pathology results.                        - Return to referring physician as previously                         scheduled. Procedure Code(s):     --- Professional ---                        [phone], Esophagogastroduodenoscopy, flexible,                         transoral; with transendoscopic balloon dilation of                         esophagus (less than 30 mm diameter) Diagnosis Code(s):     --- Professional ---                        K21.00, Gastro-esophageal reflux disease with                         esophagitis, without bleeding                        K44.9, Diaphragmatic hernia without obstruction or                         gangrene                        R13.10, Dysphagia, unspecified CPT copyright 2022  American Medical Association. All rights reserved. The codes documented in this report are preliminary and upon coder review may  be revised to meet current compliance requirements. Eather Colas MD, MD 09/01/2022 12:03:23 PM Number of Addenda: 0 Note Initiated On: 09/01/2022 11:12 AM Estimated Blood Loss:  Estimated blood loss was minimal.      [facility]

## 2022-09-01 NOTE — H&P (Signed)
Outpatient short stay form Pre-procedure 09/01/2022  [name], MD  Primary Physician: Barbette Reichmann, MD  Reason for visit:  History of esophagitis  History of present illness:    47 y/o lady with history of obesity, hld, GERD, and hypertension here for EGD to assess healing of esophagitis and dysphagia. Has dysphagia to solids and liquids. No blood thinners. No family history of GI malignancies. No neck surgeries.    Current Facility-Administered Medications:    0.9 %  sodium chloride infusion, , Intravenous, Continuous, Cypher Paule, Rossie Muskrat, MD, Last Rate: 20 mL/hr at 09/01/22 1048, 1,000 mL at 09/01/22 1048  Medications Prior to Admission  Medication Sig Dispense Refill Last Dose   atorvastatin (LIPITOR) 10 MG tablet Take 10 mg by mouth daily at 6 PM.   08/31/2022   busPIRone (BUSPAR) 5 MG tablet Take 5 mg by mouth 2 (two) times daily.   08/31/2022   Cholecalciferol 25 MCG (1000 UT) tablet Take 1,000 Units by mouth daily.    08/31/2022   escitalopram (LEXAPRO) 10 MG tablet Take 10 mg by mouth daily.   08/31/2022   lisinopril (PRINIVIL,ZESTRIL) 10 MG tablet Take 10 mg by mouth daily.   08/31/2022   meloxicam (MOBIC) 15 MG tablet Take 1 tablet (15 mg total) by mouth daily. 30 tablet 0 08/31/2022   nortriptyline (PAMELOR) 10 MG capsule Take 20 mg by mouth at bedtime.   08/31/2022   Prenatal Vit-Fe Fumarate-FA (PRENATAL MULTIVITAMIN) TABS tablet Take 1 tablet by mouth daily at 12 noon.   08/31/2022   traMADol (ULTRAM) 50 MG tablet Take 1 tablet (50 mg total) by mouth 2 (two) times daily. 14 tablet 0 08/31/2022   vitamin B-12 (CYANOCOBALAMIN) 100 MCG tablet Take 100 mcg by mouth daily.   08/31/2022   acetaZOLAMIDE (DIAMOX) 250 MG tablet Take 2 tablets (500 mg total) by mouth 3 (three) times daily. (Patient not taking: Reported on 09/01/2022) 60 tablet 0 Not Taking   acetaZOLAMIDE (DIAMOX) 500 MG capsule Take 500 mg by mouth every evening.      baclofen (LIORESAL) 10 MG tablet Take 10 mg by mouth.  (Patient not taking: Reported on 09/01/2022)   Completed Course   gabapentin (NEURONTIN) 300 MG capsule Take 300 mg by mouth 2 (two) times daily.  (Patient not taking: Reported on 01/13/2022)      ibuprofen (ADVIL,MOTRIN) 200 MG tablet Take 200 mg by mouth every 6 (six) hours as needed.    at prn   methocarbamol (ROBAXIN) 500 MG tablet Take 1 tablet (500 mg total) by mouth 4 (four) times daily. (Patient not taking: Reported on 01/13/2022) 16 tablet 0    oxyCODONE-acetaminophen (PERCOCET/ROXICET) 5-325 MG per tablet Take 1 tablet by mouth every 12 (twelve) hours.  (Patient not taking: Reported on 01/13/2022)      predniSONE (DELTASONE) 10 MG tablet Take 1 tablet (10 mg total) by mouth as directed. (Patient not taking: Reported on 01/13/2022) 42 tablet 0    rOPINIRole (REQUIP) 0.5 MG tablet Take 0.5 mg by mouth at bedtime. (Patient not taking: Reported on 01/13/2022)        Allergies  Allergen Reactions   Excedrin Extra Strength [Aspirin-Acetaminophen-Caffeine] Other (See Comments)    Hard time breathing   Maprotiline Shortness Of Breath    Other reaction(s): ANAPHYLAXIS   Trazodone Anaphylaxis    Other reaction(s): ANAPHYLAXIS   Guaifenesin     Other reaction(s): VOMITING   Hydrocodone     Other reaction(s): NAUSEA   Morphine  Other reaction(s): VOMITING     Past Medical History:  Diagnosis Date   Arthritis    Back pain    Corneal dystrophy    GERD (gastroesophageal reflux disease)    Headache    Hypertension     Review of systems:  Otherwise negative.    Physical Exam  Gen: Alert, oriented. Appears stated age.  HEENT: PERRLA. Lungs: No respiratory distress CV: RRR Abd: soft, benign, no masses Ext: No edema    Planned procedures: Proceed with EGD. The patient understands the nature of the planned procedure, indications, risks, alternatives and potential complications including but not limited to bleeding, infection, perforation, damage to internal organs and possible  oversedation/side effects from anesthesia. The patient agrees and gives consent to proceed.  Please refer to procedure notes for findings, recommendations and patient disposition/instructions.     [name], MD [facility] Gastroenterology

## 2022-09-01 NOTE — Anesthesia Preprocedure Evaluation (Signed)
Anesthesia Evaluation  Patient identified by MRN, date of birth, ID band Patient awake    Reviewed: Allergy & Precautions, NPO status , Patient's Chart, lab work & pertinent test results  History of Anesthesia Complications Negative for: history of anesthetic complications  Airway Mallampati: II  TM Distance: >3 FB Neck ROM: Full    Dental no notable dental hx.    Pulmonary neg pulmonary ROS, former smoker   Pulmonary exam normal breath sounds clear to auscultation       Cardiovascular hypertension, Pt. on medications (-) angina (-) Past MI and (-) Cardiac Stents Normal cardiovascular exam(-) dysrhythmias (-) Valvular Problems/Murmurs Rhythm:Regular Rate:Normal     Neuro/Psych  Headaches, neg Seizures  Neuromuscular disease  negative psych ROS   GI/Hepatic Neg liver ROS,GERD  ,,  Endo/Other  negative endocrine ROS    Renal/GU negative Renal ROS  negative genitourinary   Musculoskeletal  (+) Arthritis , Osteoarthritis,  Chronic back pain   Abdominal   Peds negative pediatric ROS (+)  Hematology negative hematology ROS (+)   Anesthesia Other Findings Multiple cornea transplants   Reproductive/Obstetrics negative OB ROS                             Anesthesia Physical Anesthesia Plan  ASA: 2  Anesthesia Plan: General   Post-op Pain Management: Minimal or no pain anticipated   Induction: Intravenous  PONV Risk Score and Plan: 1 and Propofol infusion and TIVA  Airway Management Planned: Natural Airway and Nasal Cannula  Additional Equipment:   Intra-op Plan:   Post-operative Plan:   Informed Consent: I have reviewed the patients History and Physical, chart, labs and discussed the procedure including the risks, benefits and alternatives for the proposed anesthesia with the patient or authorized representative who has indicated his/her understanding and acceptance.     Dental  Advisory Given  Plan Discussed with: Anesthesiologist, CRNA and Surgeon  Anesthesia Plan Comments: (Patient consented for risks of anesthesia including but not limited to:  - adverse reactions to medications - risk of airway placement if required - damage to eyes, teeth, lips or other oral mucosa - nerve damage due to positioning  - sore throat or hoarseness - Damage to heart, brain, nerves, lungs, other parts of body or loss of life  Patient voiced understanding.)        Anesthesia Quick Evaluation

## 2022-09-01 NOTE — Anesthesia Procedure Notes (Signed)
Procedure Name: General with mask airway Date/Time: 09/01/2022 11:41 AM  Performed by: Mohammed Kindle, CRNAPre-anesthesia Checklist: Patient identified, Emergency Drugs available, Suction available and Patient being monitored Patient Re-evaluated:Patient Re-evaluated prior to induction Oxygen Delivery Method: Simple face mask Induction Type: IV induction Placement Confirmation: positive ETCO2, CO2 detector and breath sounds checked- equal and bilateral Dental Injury: Teeth and Oropharynx as per pre-operative assessment

## 2022-09-01 NOTE — Transfer of Care (Signed)
Immediate Anesthesia Transfer of Care Note  Patient: Danielle Tran  Procedure(s) Performed: ESOPHAGOGASTRODUODENOSCOPY (EGD) WITH PROPOFOL  Patient Location: Endoscopy Unit  Anesthesia Type:General  Level of Consciousness: drowsy and patient cooperative  Airway & Oxygen Therapy: Patient Spontanous Breathing and Patient connected to face mask oxygen  Post-op Assessment: Report given to RN and Post -op Vital signs reviewed and stable  Post vital signs: Reviewed and stable  Last Vitals:  Vitals Value Taken Time  BP    Temp    Pulse    Resp    SpO2      Last Pain:  Vitals:   09/01/22 1033  TempSrc: Temporal         Complications: No notable events documented.

## 2022-09-02 ENCOUNTER — Encounter: Payer: Self-pay | Admitting: Gastroenterology

## 2022-09-02 LAB — SURGICAL PATHOLOGY

## 2022-09-05 NOTE — Anesthesia Postprocedure Evaluation (Signed)
Anesthesia Post Note  Patient: Danielle Tran  Procedure(s) Performed: ESOPHAGOGASTRODUODENOSCOPY (EGD) WITH PROPOFOL  Patient location during evaluation: Endoscopy Anesthesia Type: General Level of consciousness: awake and alert Pain management: pain level controlled Vital Signs Assessment: post-procedure vital signs reviewed and stable Respiratory status: spontaneous breathing, nonlabored ventilation, respiratory function stable and patient connected to nasal cannula oxygen Cardiovascular status: blood pressure returned to baseline and stable Postop Assessment: no apparent nausea or vomiting Anesthetic complications: no   No notable events documented.   Last Vitals:  Vitals:   09/01/22 1205 09/01/22 1215  BP: 119/84 109/86  Pulse: 96 89  Resp: (!) 21 13  Temp:    SpO2: 95% 94%    Last Pain:  Vitals:   09/02/22 0739  TempSrc:   PainSc: 0-No pain                 Lenard Simmer

## 2022-09-25 ENCOUNTER — Other Ambulatory Visit: Payer: Self-pay | Admitting: Student

## 2022-09-25 DIAGNOSIS — G932 Benign intracranial hypertension: Secondary | ICD-10-CM

## 2022-09-29 ENCOUNTER — Other Ambulatory Visit: Payer: Self-pay | Admitting: Student

## 2022-09-29 DIAGNOSIS — R519 Headache, unspecified: Secondary | ICD-10-CM

## 2022-09-29 DIAGNOSIS — G932 Benign intracranial hypertension: Secondary | ICD-10-CM

## 2022-10-01 ENCOUNTER — Ambulatory Visit: Admission: RE | Admit: 2022-10-01 | Payer: 59 | Source: Ambulatory Visit

## 2022-10-12 ENCOUNTER — Other Ambulatory Visit: Payer: Self-pay

## 2022-10-12 ENCOUNTER — Encounter: Payer: Self-pay | Admitting: Student

## 2022-10-12 ENCOUNTER — Ambulatory Visit
Admission: RE | Admit: 2022-10-12 | Discharge: 2022-10-12 | Disposition: A | Discharge status: Home / Self Care | Payer: Medicare Other | Source: Ambulatory Visit | Attending: Student | Admitting: Student

## 2022-10-12 DIAGNOSIS — G932 Benign intracranial hypertension: Secondary | ICD-10-CM | POA: Diagnosis present

## 2022-10-12 MED ORDER — LIDOCAINE HCL (PF) 1 % IJ SOLN
5.0000 mL | Freq: Once | INTRAMUSCULAR | Status: AC
  Administered 2022-10-12: 2 mL
  Filled 2022-10-12: qty 5

## 2022-10-12 NOTE — Procedures (Signed)
L3-L4 therapeutic lumbar puncture with removal of 23.5 ml of clear CSF. Please see full dictation under the imaging tab in Epic.  Alwyn Ren, [place] [phone] 10/12/2022, 1:21 PM

## 2022-10-14 ENCOUNTER — Ambulatory Visit
Admission: RE | Admit: 2022-10-14 | Discharge: 2022-10-14 | Disposition: A | Discharge status: Home / Self Care | Payer: 59 | Source: Ambulatory Visit | Attending: Student | Admitting: Student

## 2022-10-14 DIAGNOSIS — G8929 Other chronic pain: Secondary | ICD-10-CM

## 2022-10-14 DIAGNOSIS — G932 Benign intracranial hypertension: Secondary | ICD-10-CM

## 2023-01-11 NOTE — Therapy (Unsigned)
OUTPATIENT PHYSICAL THERAPY VESTIBULAR EVALUATION   Patient Name: Danielle Tran MRN: [id number] DOB:25-Jan-1976, 47 y.o., female Today's Date: 01/12/2023  END OF SESSION:  PT End of Session - 01/12/23 1612     Visit Number 1    Number of Visits 13    Date for PT Re-Evaluation 04/06/23    PT Start Time 0816   Pt arrived late for evaluation.   PT Stop Time 0847    PT Time Calculation (min) 31 min    Activity Tolerance Patient tolerated treatment well    Behavior During Therapy WFL for tasks assessed/performed             Past Medical History:  Diagnosis Date   Arthritis    Back pain    Corneal dystrophy    GERD (gastroesophageal reflux disease)    Headache    Hypertension    Past Surgical History:  Procedure Laterality Date   ABDOMINAL HYSTERECTOMY     BACK SURGERY     CARPAL TUNNEL RELEASE Bilateral    COLONOSCOPY N/A 01/13/2022   Procedure: COLONOSCOPY;  Surgeon: [name], MD;  Location: [facility] ENDOSCOPY;  Service: Endoscopy;  Laterality: N/A;   ESOPHAGOGASTRODUODENOSCOPY N/A 01/13/2022   Procedure: ESOPHAGOGASTRODUODENOSCOPY (EGD);  Surgeon: [name], MD;  Location: [facility] ENDOSCOPY;  Service: Endoscopy;  Laterality: N/A;   ESOPHAGOGASTRODUODENOSCOPY (EGD) WITH PROPOFOL N/A 09/01/2022   Procedure: ESOPHAGOGASTRODUODENOSCOPY (EGD) WITH PROPOFOL;  Surgeon: [name], MD;  Location: [facility] ENDOSCOPY;  Service: Endoscopy;  Laterality: N/A;   EYE SURGERY Left    partial cornea transplant x5   REDUCTION MAMMAPLASTY Bilateral 1994   Patient Active Problem List   Diagnosis Date Noted   Intractable headache 07/18/2015   PCP: Barbette Reichmann, MD REFERRING PROVIDER: Lonell Face, MD  REFERRING DIAG: H81.10 (ICD-10-CM) - Benign paroxysmal positional vertigo  THERAPY DIAG:  Dizziness and giddiness  Unsteadiness on feet  ONSET DATE: 2 years ago  Rationale for Evaluation and Treatment: Rehabilitation  SUBJECTIVE:       SUBJECTIVE  STATEMENT: Pt reports that she has a slight headache this am and a little more dizziness than normal today because of the headache.   Pt accompanied by: self  PERTINENT HISTORY:  Pt states that she was diagnoses with tinnitus about 2 years ago. Then, she started getting migraines and they said she had a seizure. Pt states after that she states she "can't keep balance" and has "watery head/wobbly head". 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 Pt states she saw an ENT and she said they looked in her ears and sent her to the neurologist. Pt reports she has not had a VNG test. Pt states she is still having intense migraines and states she can go 5-6 days with one. Pt states she has tried different medicines. Pt states she has to do 1 room at a time with sweeping or mopping and she needs to rest. Pt reports she drives. Pt is on disability.   Per neurology MD note 10/26/22, complaint of positional vertigo with forward bending - causes severe vertigo, lasts 45 - 60 seconds. This is chronic for her and previously noted. Also triggered by sit to stand. Occurs every day. Now syncope, loss of conciousness, falls. Denies new neurologic symptoms. Denies weakness, numbness, tingling, visual changes (blurry vision, vision loss, double vision), change in headache (headaches are unchanged in character/quality, severity). Increased pressure with sneezing/coughing  brief frontal sharp pressure, this is new as of this week. Balance, coordination well maintained. Chronic low back pain, unchanged. Neck pain, chronic, unchanged. Bowel/bladder control well maintained without incontinence or saddle anesthesia. Several near falls but no falls.    PAIN:  Are you having pain? Yes: NPRS scale: 8/10 Pain location: low back pain Pain description: shooting pain down left side and right hip   PRECAUTIONS: Fall  RED FLAGS: (dysarthria,  dysphagia, drop attacks, bowel and bladder changes, recent weight loss/gain)    Review of systems negative for red flags.    WEIGHT BEARING RESTRICTIONS: No  FALLS: Has patient fallen in last 6 months? No in last 6 months. Pt reports 3 in the past year during episodes of migraines and dizziness.   LIVING ENVIRONMENT: Lives with: lives with their son Lives in: House/apartment Stairs: Yes: Internal: 4 right rail steps; on right going up Has following equipment at home:  none  PLOF: Independent with community mobility without device, Vocation/Vocational requirements: pt reports she is on disability, and Leisure: used to play softball and Programme researcher, broadcasting/film/video crafts and making wreaths everyday and spend time with friends  Pt states she has not been able to do these things because of the looking up and down and her eyesight.   PATIENT GOALS: Pt would like to be able to bend over, be able to read a book and to be able to do crafting and be able to go out with a friend.   OBJECTIVE:   DIAGNOSTIC FINDINGS:  MRI Cervical Spine (C-Spine) 10/14/2022 - 1. Bulging discs and osteophytic ridging at C5-6 and C6-7 with mild mass effect on the ventral thecal sac and narrowing the ventral CSF space. No significant spinal or foraminal stenosis. 2. Mild left foraminal narrowing at C3-4. 3. Normal MR appearance of the cervical spinal cord.   Lumbar Puncture 10/12/2022 - within normal limits, opening pressure 17 cm H2O, closing pressure 9 cm H2O   Cranial Nerves Visual acuity and visual fields are intact  Extraocular muscles are intact  Hearing is normal as tested by gross conversation Palate elevates midline, normal phonation   COGNITION: Overall cognitive status: Within functional limits for tasks assessed   SENSATION: Pt reports numbness down her left leg extending down just past her knee going down back of her leg that is constant.   COORDINATION: Finger to Nose: Normal  POSTURE:  No Significant  postural limitations and rounded shoulders  Cervical ROM:   AROM cervical spine flexion, extension and right rotation WNL. AROM cervical left rotation WFL with decreased left rotation grossly about 60 degrees  LOWER EXTREMITY MMT: deferred  TRANSFERS: Sit to stand: Complete Independence Stand to sit: Complete Independence  GAIT: Gait pattern: step through pattern Distance walked: 150' Assistive device utilized: None Level of assistance: CGA Comments: pt ambulated with and without head turns horizontal and vertical. Noted mild change in gait speed with one episode of veering with horizontal head turns. Pt reports 5/10 spinning dizziness with this activity.   FUNCTIONAL OUTCOME MEASURES:  Results Comments  DHI      68/100 Moderate perception of handicap; in need of intervention  ABC Scale      72.5%  Scores of 5080% indicate moderate level of physical functioning; in need of intervention  DGI     To be assessed next session   FOTO To be assessed next session scale (39-70) with higher numbers indicating greater function   Dizziness Positional Status (DFS) To be assessed next session  Scale 27-67; in need of intervention  10 meter Walking Speed To be assessed next session     VESTIBULAR ASSESSMENT:  GENERAL OBSERVATION: Pt arrives to clinic ambulating without AD with fair scanning of visual environment.   VESTIBULAR SYMPTOM BEHAVIOR: Non-Vestibular symptoms: headaches, tinnitus, and migraine symptoms, nausea Type of dizziness: Blurred Vision, Imbalance (Disequilibrium), Spinning/Vertigo, Unsteady with head/body turns, Lightheadedness/Faint, and "Swimmyheaded" Frequency: daily episodes  Duration: lasts seconds because if she starts to get to dizzy she has to rest and stay still   Aggravating factors: Induced by position change: quick body turns, bending over, quick head turns.  Relieving factors: lying supine, closing eyes, and rest Progression of symptoms: worse History of  similar episodes: no  Description of dizziness: vertigo, unsteadiness, lightheadedness, whoozy, swimmy-headed sensation, aural fullness and at times has fallen. Pt states she feels like she "always feels like I have to pop my eardrums" Symptom nature: motion provoked, positional, intermittent  Auditory complaints (tinnitus, pain, drainage): reports mild drainage in ears and tinnitus 24/7 in both ears.   Vision (last eye exam, diplopia, recent changes): Pt just got ordered trifocal glasses but has not received. Pt reports blurry vision when she is having a migraine, and that she has had spots in her vision in the past 2 months. Pt states she saw the eye doctor before these symptoms started and she returns next month for follow-up.     POSTURAL CONTROL TESTS:  Clinical Test of Sensory Interaction for Balance (CTSIB): To be assessed  POSITIONAL TESTING: Other: To be assessed next session secondary to time constraints this date.  OCULOMOTOR / VESTIBULAR TESTING: Oculomotor Exam- Room Light  Normal Abnormal Comments  Ocular Alignment N    Ocular ROM N    Spontaneous Nystagmus N    Gaze evoked Nystagmus  Abn  Noted nystagmus at end range bilaterally and patient reports sensation of pressure  Smooth Pursuit N    Saccades  Abn Hypometric undershoots and increases patients dizziness  VOR  Abn   VOR Cancellation  Abn Increased blurry vision  Left Head Impulse  Abn Increased dizziness  Right Head Impulse  Abn Increased dizziness  Head Shaking Nystagmus   Deferred secondary to time constraints  Static Acuity   Deferred secondary to time constraints  Dynamic Acuity   Deferred secondary to time constraints   VESTIBULAR TREATMENT:                                                                                                   DATE: Eval only this date secondary to time constraints   PATIENT EDUCATION: Education details: Discussed plan of care and goals Person educated: Patient Education  method: Explanation and Verbal cues Education comprehension: verbalized understanding  HOME EXERCISE PROGRAM:  GOALS: Goals reviewed with patient? Yes  SHORT TERM GOALS: Target date: 02/09/2023  Pt will be independent with HEP in order to improve balance and decrease dizziness symptoms in order to decrease fall risk and improve function at home and work. Baseline: no HEP issued on 01/12/23 Goal status: INITIAL  LONG TERM GOALS: Target date: 04/06/2023  Patient will have improved FOTO score of 6 points or greater in order to demonstrate improvements in patient's ADLs and functional performance.  Baseline: to be performed next session Goal status: INITIAL  2.  Patient will demonstrate reduced falls risk as evidenced by Dynamic Gait Index (DGI) >19/24. Baseline: to be performed next session Goal status: INITIAL  3. Patient will have demonstrate decreased falls risk as indicated by Activities Specific Balance Confidence Scale score of 80% or greater. Baseline: scored 72.5% on 01/12/23 Goal status: INITIAL  4.  Patient will reduce perceived disability to low levels as indicated by <40 on Dizziness Handicap Inventory. Baseline: scored 68/100 on 01/12/23;  Goal status: INITIAL  5.  Patient will report 50% or greater improvement in their symptoms of dizziness and imbalance with provoking motions or positions  Baseline: 01/12/23 pt reports dizziness and imbalance with quick turns, body turn and head turning activities;  Goal status: INITIAL   ASSESSMENT:  CLINICAL IMPRESSION: Patient is a 47 y.o. female who was seen today for physical therapy evaluation and treatment for dizziness and giddiness. PT examination reveals deficits . Pt presents with deficits in strength, gait and balance. Pt will benefit from skilled PT services to address deficits in balance and decrease risk for future falls.   OBJECTIVE IMPAIRMENTS: decreased balance, difficulty walking, decreased ROM, dizziness, impaired  sensation, and impaired vision/preception.   ACTIVITY LIMITATIONS: bending, bathing, hygiene/grooming, and crafts-painting, reading  PARTICIPATION LIMITATIONS: cleaning, laundry, interpersonal relationship, driving, community activity, occupation, and hobbies  PERSONAL FACTORS: Time since onset of injury/illness/exacerbation and 3+ comorbidities: chronic intractable headaches, migraines, cervicalgia, neuropathy  are also affecting patient's functional outcome.   REHAB POTENTIAL: Fair secondary to migraines  CLINICAL DECISION MAKING: Evolving/moderate complexity  EVALUATION COMPLEXITY: Moderate   PLAN:  PT FREQUENCY: 1-2x/week  PT DURATION: 12 weeks  PLANNED INTERVENTIONS: Therapeutic exercises, Therapeutic activity, Neuromuscular re-education, Balance training, Gait training, Patient/Family education, Self Care, Vestibular training, Canalith repositioning, Visual/preceptual remediation/compensation, and Manual therapy  PLAN FOR NEXT SESSION: Complete testing including modfied CTSIB, DGI, FOTO, canal testing, DVA test. Initiate home exercise program. Work on VOR X1 in sitting with plain background.    Mardelle Matte, PT 01/12/2023, 4:16 PM

## 2023-01-12 ENCOUNTER — Other Ambulatory Visit: Payer: Self-pay

## 2023-01-12 ENCOUNTER — Ambulatory Visit: Payer: Medicare Other | Attending: Neurology | Admitting: Physical Therapy

## 2023-01-12 DIAGNOSIS — R42 Dizziness and giddiness: Secondary | ICD-10-CM

## 2023-01-12 DIAGNOSIS — R2681 Unsteadiness on feet: Secondary | ICD-10-CM | POA: Diagnosis present

## 2023-01-19 ENCOUNTER — Ambulatory Visit: Payer: Medicare Other

## 2023-01-19 DIAGNOSIS — R42 Dizziness and giddiness: Secondary | ICD-10-CM

## 2023-01-19 DIAGNOSIS — R2681 Unsteadiness on feet: Secondary | ICD-10-CM

## 2023-01-19 NOTE — Therapy (Signed)
OUTPATIENT PHYSICAL THERAPY VESTIBULAR TREATMENT NOTE   Patient Name: Danielle Tran MRN: [id number] DOB:Jul 29, 1975, 47 y.o., female Today's Date: 01/19/2023  PCP: Barbette Reichmann, MD  REFERRING PROVIDER: Lonell Face, MD   END OF SESSION:  PT End of Session - 01/19/23 0845     Visit Number 2    Number of Visits 13    Date for PT Re-Evaluation 04/06/23    PT Start Time 0847    PT Stop Time 0929    PT Time Calculation (min) 42 min    Activity Tolerance Patient tolerated treatment well    Behavior During Therapy WFL for tasks assessed/performed             Past Medical History:  Diagnosis Date   Arthritis    Back pain    Corneal dystrophy    GERD (gastroesophageal reflux disease)    Headache    Hypertension    Past Surgical History:  Procedure Laterality Date   ABDOMINAL HYSTERECTOMY     BACK SURGERY     CARPAL TUNNEL RELEASE Bilateral    COLONOSCOPY N/A 01/13/2022   Procedure: COLONOSCOPY;  Surgeon: [name], MD;  Location: [facility] ENDOSCOPY;  Service: Endoscopy;  Laterality: N/A;   ESOPHAGOGASTRODUODENOSCOPY N/A 01/13/2022   Procedure: ESOPHAGOGASTRODUODENOSCOPY (EGD);  Surgeon: [name], MD;  Location: [facility] ENDOSCOPY;  Service: Endoscopy;  Laterality: N/A;   ESOPHAGOGASTRODUODENOSCOPY (EGD) WITH PROPOFOL N/A 09/01/2022   Procedure: ESOPHAGOGASTRODUODENOSCOPY (EGD) WITH PROPOFOL;  Surgeon: [name], MD;  Location: [facility] ENDOSCOPY;  Service: Endoscopy;  Laterality: N/A;   EYE SURGERY Left    partial cornea transplant x5   REDUCTION MAMMAPLASTY Bilateral 1994   Patient Active Problem List   Diagnosis Date Noted   Intractable headache 07/18/2015    ONSET DATE: 2 years ago  REFERRING DIAG: H81.10 (ICD-10-CM) - Benign paroxysmal positional vertigo   THERAPY DIAG:  Dizziness and giddiness  Unsteadiness on feet  Rationale for Evaluation and Treatment: Rehabilitation  PERTINENT HISTORY: Pt states that she was diagnoses with  tinnitus about 2 years ago. Then, she started getting migraines and they said she had a seizure. Pt states after that she states she "can't keep balance" and has "watery head/wobbly head". 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 Pt states she saw an ENT and she said they looked in her ears and sent her to the neurologist. Pt reports she has not had a VNG test. Pt states she is still having intense migraines and states she can go 5-6 days with one. Pt states she has tried different medicines. Pt states she has to do 1 room at a time with sweeping or mopping and she needs to rest. Pt reports she drives. Pt is on disability.    Per neurology MD note 10/26/22, complaint of positional vertigo with forward bending - causes severe vertigo, lasts 45 - 60 seconds. This is chronic for her and previously noted. Also triggered by sit to stand. Occurs every day. Now syncope, loss of conciousness, falls. Denies new neurologic symptoms. Denies weakness, numbness, tingling, visual changes (blurry vision, vision loss, double vision), change in headache (headaches are unchanged in character/quality, severity). Increased pressure with sneezing/coughing brief frontal sharp pressure, this is new as of this week. Balance, coordination well maintained. Chronic low back pain, unchanged. Neck pain, chronic, unchanged. Bowel/bladder control well maintained without incontinence or saddle anesthesia. Several near falls but no falls.  PRECAUTIONS:  Fall  SUBJECTIVE: Patient reports she received new glasses, bifocals. Reports currently feeling mild dizziness at baselines. Reports pressure in head but denies headache, feels like it is pressure.   PAIN:  Are you having pain? No   OBJECTIVE:  DVA:   Static: 7  Dynamic: 3 (moderate dizziness, mild unsteadiness)    M-CTSIB: Situation 1: 30 seconds, Situation 2: 30 seconds, Situation 3: 30  seconds, Situation 4: 25 seconds (increased postural sway, moderate dizziness)      [facility] PT Assessment - 01/19/23 0001       Standardized Balance Assessment   Standardized Balance Assessment Dynamic Gait Index      Dynamic Gait Index   Level Surface Mild Impairment    Change in Gait Speed Mild Impairment    Gait with Horizontal Head Turns Moderate Impairment    Gait with Vertical Head Turns Mild Impairment    Gait and Pivot Turn Mild Impairment    Step Over Obstacle Mild Impairment    Step Around Obstacles Normal    Steps Mild Impairment    Total Score 16      Functional Gait  Assessment   Gait assessed  Yes    Gait Level Surface Walks 20 ft in less than 7 sec but greater than 5.5 sec, uses assistive device, slower speed, mild gait deviations, or deviates 6-10 in outside of the 12 in walkway width.    Change in Gait Speed Able to change speed, demonstrates mild gait deviations, deviates 6-10 in outside of the 12 in walkway width, or no gait deviations, unable to achieve a major change in velocity, or uses a change in velocity, or uses an assistive device.    Gait with Horizontal Head Turns Performs head turns with moderate changes in gait velocity, slows down, deviates 10-15 in outside 12 in walkway width but recovers, can continue to walk.    Gait with Vertical Head Turns Performs task with slight change in gait velocity (eg, minor disruption to smooth gait path), deviates 6 - 10 in outside 12 in walkway width or uses assistive device    Gait and Pivot Turn Pivot turns safely in greater than 3 sec and stops with no loss of balance, or pivot turns safely within 3 sec and stops with mild imbalance, requires small steps to catch balance.    Step Over Obstacle Is able to step over one shoe box (4.5 in total height) without changing gait speed. No evidence of imbalance.    Gait with Narrow Base of Support Ambulates 7-9 steps.    Gait with Eyes Closed Walks 20 ft, uses assistive device,  slower speed, mild gait deviations, deviates 6-10 in outside 12 in walkway width. Ambulates 20 ft in less than 9 sec but greater than 7 sec.    Ambulating Backwards Walks 20 ft, uses assistive device, slower speed, mild gait deviations, deviates 6-10 in outside 12 in walkway width.    Steps Alternating feet, must use rail.    Total Score 19    FGA comment: 19/30            Motion Sensitivity Quotient  Intensity: 0 = none, 1 = Lightheaded, 2 = Mild, 3 = Moderate, 4 = Severe, 5 = Vomiting  Intensity  1. Sitting to supine 2  2. Supine to L side 0  3. Supine to R side 0  4. Supine to sitting 3  5. L Hallpike-Dix 0  6. Up from L  3  7. R Hallpike-Dix 0  8. Up from R  3  9. Sitting, head  tipped to L knee 2  10. Head up from L  knee 3  11. Sitting, head  tipped to R knee 2  12. Head up from R  knee 3  13. Sitting head turns x5   14.Sitting head nods x5   15. In stance, 180  turn to L    16. In stance, 180  turn to R      POSITIONAL TESTS:  Right Dix-Hallpike: no nystagmus Left Dix-Hallpike: no nystagmus Right Roll Test: no nystagmus Left Roll Test: no nystagmus     VESTIBULAR TREATMENT:  Gaze Adaptation: x1 Viewing Horizontal: Position: seated, Time: 30 seconds, Reps: 2, and Comment: moderate dizziness, mild blurred vision and x1 Viewing Vertical:  Position: seated, Time: 30 seconds, Reps: 2, and Comment: moderate dizziness, mild blurred vision  Gaze Stabilization: Sitting    Keeping eyes on target on wall 3-4 feet away, tilt head down 15-30 and move head side to side for 30 seconds. Repeat while moving head up and down for 30 seconds. Do 2-3 sessions per day.   PATIENT EDUCATION: Education details: Continued evaluation findings; Initial HEP Person educated: Patient Education method: Explanation and Verbal cues Education comprehension: verbalized understanding   HOME EXERCISE PROGRAM:   GOALS: Goals reviewed with patient? Yes   SHORT TERM GOALS:  Target date: 02/09/2023   Pt will be independent with HEP in order to improve balance and decrease dizziness symptoms in order to decrease fall risk and improve function at home and work. Baseline: no HEP issued on 01/12/23 Goal status: INITIAL   LONG TERM GOALS: Target date: 04/06/2023   Patient will have improved FOTO score of 6 points or greater in order to demonstrate improvements in patient's ADLs and functional performance.  Baseline: to be performed next session Goal status: INITIAL   2.  Patient will demonstrate reduced falls risk as evidenced by Dynamic Gait Index (DGI) >19/24. Baseline:  Goal status: INITIAL   3. Patient will have demonstrate decreased falls risk as indicated by Activities Specific Balance Confidence Scale score of 80% or greater. Baseline: scored 72.5% on 01/12/23 Goal status: INITIAL   4.  Patient will reduce perceived disability to low levels as indicated by <40 on Dizziness Handicap Inventory. Baseline: scored 68/100 on 01/12/23;  Goal status: INITIAL   5.  Patient will report 50% or greater improvement in their symptoms of dizziness and imbalance with provoking motions or positions  Baseline: 01/12/23 pt reports dizziness and imbalance with quick turns, body turn and head turning activities;  Goal status: INITIAL     ASSESSMENT:   CLINICAL IMPRESSION: Today's skilled PT session focused on continued assessment, patient demonstrating increased risk for falls with score of 16/30 on DGI and 19/30 on FGA. Patient also had increased motion sensitivity on MSQ, and 4 line difference on DVA with moderate dizziness. Positional testing was negative this date. Rest of session focused on establishing initial HEP for gaze stabilization with plain background. Pt tolerating well. Will continue to progress toward all LTGs.    OBJECTIVE IMPAIRMENTS: decreased balance, difficulty walking, decreased ROM, dizziness, impaired sensation, and impaired vision/preception.     ACTIVITY LIMITATIONS: bending, bathing, hygiene/grooming, and crafts-painting, reading   PARTICIPATION LIMITATIONS: cleaning, laundry, interpersonal relationship, driving, community activity, occupation, and hobbies   PERSONAL FACTORS: Time since onset of injury/illness/exacerbation and 3+ comorbidities: chronic intractable headaches, migraines, cervicalgia, neuropathy  are also affecting patient's functional outcome.    REHAB POTENTIAL: Fair  secondary to migraines   CLINICAL DECISION MAKING: Evolving/moderate complexity   EVALUATION COMPLEXITY: Moderate     PLAN:   PT FREQUENCY: 1-2x/week   PT DURATION: 12 weeks   PLANNED INTERVENTIONS: Therapeutic exercises, Therapeutic activity, Neuromuscular re-education, Balance training, Gait training, Patient/Family education, Self Care, Vestibular training, Canalith repositioning, Visual/preceptual remediation/compensation, and Manual therapy  PLAN FOR NEXT SESSION: Add to HEP for balance/habituation, continue to work on VOR X1 in sitting with plain background.    Creed Copper Fairly, PT, DPT 01/19/23 10:25 AM

## 2023-01-21 ENCOUNTER — Ambulatory Visit: Payer: Medicare Other

## 2023-01-21 DIAGNOSIS — R42 Dizziness and giddiness: Secondary | ICD-10-CM

## 2023-01-21 DIAGNOSIS — R2681 Unsteadiness on feet: Secondary | ICD-10-CM

## 2023-01-21 NOTE — Therapy (Signed)
OUTPATIENT PHYSICAL THERAPY VESTIBULAR TREATMENT NOTE   Patient Name: Danielle Tran MRN: [id number] DOB:08/25/1975, 47 y.o., female Today's Date: 01/21/2023  PCP: Barbette Reichmann, MD  REFERRING PROVIDER: Lonell Face, MD   END OF SESSION:  PT End of Session - 01/21/23 0824     Visit Number 3    Number of Visits 13    Date for PT Re-Evaluation 04/06/23    PT Start Time 0830    PT Stop Time 0914    PT Time Calculation (min) 44 min    Activity Tolerance Patient tolerated treatment well    Behavior During Therapy WFL for tasks assessed/performed             Past Medical History:  Diagnosis Date   Arthritis    Back pain    Corneal dystrophy    GERD (gastroesophageal reflux disease)    Headache    Hypertension    Past Surgical History:  Procedure Laterality Date   ABDOMINAL HYSTERECTOMY     BACK SURGERY     CARPAL TUNNEL RELEASE Bilateral    COLONOSCOPY N/A 01/13/2022   Procedure: COLONOSCOPY;  Surgeon: [name], MD;  Location: [facility] ENDOSCOPY;  Service: Endoscopy;  Laterality: N/A;   ESOPHAGOGASTRODUODENOSCOPY N/A 01/13/2022   Procedure: ESOPHAGOGASTRODUODENOSCOPY (EGD);  Surgeon: [name], MD;  Location: [facility] ENDOSCOPY;  Service: Endoscopy;  Laterality: N/A;   ESOPHAGOGASTRODUODENOSCOPY (EGD) WITH PROPOFOL N/A 09/01/2022   Procedure: ESOPHAGOGASTRODUODENOSCOPY (EGD) WITH PROPOFOL;  Surgeon: [name], MD;  Location: [facility] ENDOSCOPY;  Service: Endoscopy;  Laterality: N/A;   EYE SURGERY Left    partial cornea transplant x5   REDUCTION MAMMAPLASTY Bilateral 1994   Patient Active Problem List   Diagnosis Date Noted   Intractable headache 07/18/2015    ONSET DATE: 2 years ago  REFERRING DIAG: H81.10 (ICD-10-CM) - Benign paroxysmal positional vertigo   THERAPY DIAG:  Dizziness and giddiness  Unsteadiness on feet  Rationale for Evaluation and Treatment: Rehabilitation  PERTINENT HISTORY: Pt states that she was diagnoses with  tinnitus about 2 years ago. Then, she started getting migraines and they said she had a seizure. Pt states after that she states she "can't keep balance" and has "watery head/wobbly head". 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 Pt states she saw an ENT and she said they looked in her ears and sent her to the neurologist. Pt reports she has not had a VNG test. Pt states she is still having intense migraines and states she can go 5-6 days with one. Pt states she has tried different medicines. Pt states she has to do 1 room at a time with sweeping or mopping and she needs to rest. Pt reports she drives. Pt is on disability.    Per neurology MD note 10/26/22, complaint of positional vertigo with forward bending - causes severe vertigo, lasts 45 - 60 seconds. This is chronic for her and previously noted. Also triggered by sit to stand. Occurs every day. Now syncope, loss of conciousness, falls. Denies new neurologic symptoms. Denies weakness, numbness, tingling, visual changes (blurry vision, vision loss, double vision), change in headache (headaches are unchanged in character/quality, severity). Increased pressure with sneezing/coughing brief frontal sharp pressure, this is new as of this week. Balance, coordination well maintained. Chronic low back pain, unchanged. Neck pain, chronic, unchanged. Bowel/bladder control well maintained without incontinence or saddle anesthesia. Several near falls but no falls.  PRECAUTIONS:  Fall  SUBJECTIVE: Patient reports no new changes/complaints. Doing the exercise she has a head heavy feeling.   PAIN:  Are you having pain? No   OBJECTIVE:  VESTIBULAR TREATMENT:  Gaze Adaptation: x1 Viewing Horizontal: Position: seated, Time: 30 seconds, 45 seconds, Reps: 2, and Comment: moderate dizziness, mild blurred vision and x1 Viewing Vertical:  Position: seated, Time: 30 seconds  seconds, Reps: 2, and Comment: moderate dizziness, mild blurred vision . Plan to keep vertical for 30 seconds, horizontal for 45 seconds.   Habituation: Completed bending to target with recall, and then turns to L to target with recall, completed x 2 lines on letter chart with moderate dizziness. Then changed direction and completed forward bending to target and then turns to R to target x 2 lines. Moderate dizziness. Short rest break required.     Standing Balance: Surface: Airex Position: Feet Hip Width Apart Completed with: Eyes Closed; x 30 seconds, 2 reps. Then completed eyes closed, Head Turns x 10 Reps and Head Nods x 10 Reps. More challenge with vertical > horizontal.    Provided following HEP this date:   Access Code: [email or web address] URL: [email or web address] Date: 01/21/2023 Prepared by: Nehemiah Settle Fairly  Exercises - Standing Balance with Eyes Closed on Foam  - 1 x daily - 5 x weekly - 1 sets - 3 reps - 30 seconds hold - Wide Stance with Eyes Closed and Head Nods on Foam Pad  - 1 x daily - 5 x weekly - 2 sets - 10 reps - Wide Stance with Eyes Closed and Head Rotation on Foam Pad  - 1 x daily - 5 x weekly - 2 sets - 10 reps   PATIENT EDUCATION: Education details: HEP Additions; Progress of VOR Person educated: Patient Education method: Explanation and Verbal cues Education comprehension: verbalized understanding   HOME EXERCISE PROGRAM: Access Code: [email or web address]   GOALS: Goals reviewed with patient? Yes   SHORT TERM GOALS: Target date: 02/09/2023   Pt will be independent with HEP in order to improve balance and decrease dizziness symptoms in order to decrease fall risk and improve function at home and work. Baseline: no HEP issued on 01/12/23 Goal status: INITIAL   LONG TERM GOALS: Target date: 04/06/2023   Patient will have improved FOTO score of 6 points or greater in order to demonstrate improvements in patient's ADLs and functional performance.  Baseline: to be  performed next session Goal status: INITIAL   2.  Patient will demonstrate reduced falls risk as evidenced by Dynamic Gait Index (DGI) >19/24. Baseline:  Goal status: INITIAL   3. Patient will have demonstrate decreased falls risk as indicated by Activities Specific Balance Confidence Scale score of 80% or greater. Baseline: scored 72.5% on 01/12/23 Goal status: INITIAL   4.  Patient will reduce perceived disability to low levels as indicated by <40 on Dizziness Handicap Inventory. Baseline: scored 68/100 on 01/12/23;  Goal status: INITIAL   5.  Patient will report 50% or greater improvement in their symptoms of dizziness and imbalance with provoking motions or positions  Baseline: 01/12/23 pt reports dizziness and imbalance with quick turns, body turn and head turning activities;  Goal status: INITIAL     ASSESSMENT:   CLINICAL IMPRESSION: Today's skilled PT session focused on progressing HEP focused on VOR x 1 and balance HEP. Patient tolerating activities well, with increase in tolerance for VOR duration.. Increased challenge with activities requiring vision removed and head movement. Initiated habituation focused on forward bending and head/body turns to  target. Will continue to progress toward all LTGs.    OBJECTIVE IMPAIRMENTS: decreased balance, difficulty walking, decreased ROM, dizziness, impaired sensation, and impaired vision/preception.    ACTIVITY LIMITATIONS: bending, bathing, hygiene/grooming, and crafts-painting, reading   PARTICIPATION LIMITATIONS: cleaning, laundry, interpersonal relationship, driving, community activity, occupation, and hobbies   PERSONAL FACTORS: Time since onset of injury/illness/exacerbation and 3+ comorbidities: chronic intractable headaches, migraines, cervicalgia, neuropathy  are also affecting patient's functional outcome.    REHAB POTENTIAL: Fair secondary to migraines   CLINICAL DECISION MAKING: Evolving/moderate complexity   EVALUATION  COMPLEXITY: Moderate     PLAN:   PT FREQUENCY: 1-2x/week   PT DURATION: 12 weeks   PLANNED INTERVENTIONS: Therapeutic exercises, Therapeutic activity, Neuromuscular re-education, Balance training, Gait training, Patient/Family education, Self Care, Vestibular training, Canalith repositioning, Visual/preceptual remediation/compensation, and Manual therapy  PLAN FOR NEXT SESSION: continue habituation to bending, motion sensitivity activities. VOR cancellation or visual tracking.    Creed Copper Fairly, PT, DPT 01/21/23 10:01 AM

## 2023-01-28 ENCOUNTER — Ambulatory Visit: Payer: Medicare Other | Attending: Neurology

## 2023-01-28 DIAGNOSIS — R42 Dizziness and giddiness: Secondary | ICD-10-CM | POA: Diagnosis present

## 2023-01-28 DIAGNOSIS — R2681 Unsteadiness on feet: Secondary | ICD-10-CM | POA: Diagnosis present

## 2023-01-28 NOTE — Therapy (Signed)
OUTPATIENT PHYSICAL THERAPY VESTIBULAR TREATMENT NOTE   Patient Name: Danielle Tran MRN: [id number] DOB:1975-11-26, 47 y.o., female Today's Date: 01/28/2023  PCP: Barbette Reichmann, MD  REFERRING PROVIDER: Lonell Face, MD   END OF SESSION:  PT End of Session - 01/28/23 0847     Visit Number 4    Number of Visits 13    Date for PT Re-Evaluation 04/06/23    PT Start Time 0846    PT Stop Time 0908   Pt requesting to end session early   PT Time Calculation (min) 22 min    Activity Tolerance Other (comment)   limited due to tolerance (dizziness/migraine)   Behavior During Therapy WFL for tasks assessed/performed             Past Medical History:  Diagnosis Date   Arthritis    Back pain    Corneal dystrophy    GERD (gastroesophageal reflux disease)    Headache    Hypertension    Past Surgical History:  Procedure Laterality Date   ABDOMINAL HYSTERECTOMY     BACK SURGERY     CARPAL TUNNEL RELEASE Bilateral    COLONOSCOPY N/A 01/13/2022   Procedure: COLONOSCOPY;  Surgeon: [name], MD;  Location: [facility] ENDOSCOPY;  Service: Endoscopy;  Laterality: N/A;   ESOPHAGOGASTRODUODENOSCOPY N/A 01/13/2022   Procedure: ESOPHAGOGASTRODUODENOSCOPY (EGD);  Surgeon: [name], MD;  Location: [facility] ENDOSCOPY;  Service: Endoscopy;  Laterality: N/A;   ESOPHAGOGASTRODUODENOSCOPY (EGD) WITH PROPOFOL N/A 09/01/2022   Procedure: ESOPHAGOGASTRODUODENOSCOPY (EGD) WITH PROPOFOL;  Surgeon: [name], MD;  Location: [facility] ENDOSCOPY;  Service: Endoscopy;  Laterality: N/A;   EYE SURGERY Left    partial cornea transplant x5   REDUCTION MAMMAPLASTY Bilateral 1994   Patient Active Problem List   Diagnosis Date Noted   Intractable headache 07/18/2015    ONSET DATE: 2 years ago  REFERRING DIAG: H81.10 (ICD-10-CM) - Benign paroxysmal positional vertigo   THERAPY DIAG:  Dizziness and giddiness  Unsteadiness on feet  Rationale for Evaluation and Treatment:  Rehabilitation  PERTINENT HISTORY: Pt states that she was diagnoses with tinnitus about 2 years ago. Then, she started getting migraines and they said she had a seizure. Pt states after that she states she "can't keep balance" and has "watery head/wobbly head". 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 Pt states she saw an ENT and she said they looked in her ears and sent her to the neurologist. Pt reports she has not had a VNG test. Pt states she is still having intense migraines and states she can go 5-6 days with one. Pt states she has tried different medicines. Pt states she has to do 1 room at a time with sweeping or mopping and she needs to rest. Pt reports she drives. Pt is on disability.    Per neurology MD note 10/26/22, complaint of positional vertigo with forward bending - causes severe vertigo, lasts 45 - 60 seconds. This is chronic for her and previously noted. Also triggered by sit to stand. Occurs every day. Now syncope, loss of conciousness, falls. Denies new neurologic symptoms. Denies weakness, numbness, tingling, visual changes (blurry vision, vision loss, double vision), change in headache (headaches are unchanged in character/quality, severity). Increased pressure with sneezing/coughing brief frontal sharp pressure, this is new as of this week. Balance, coordination well maintained. Chronic low back pain, unchanged. Neck pain, chronic, unchanged. Bowel/bladder control well maintained without incontinence  or saddle anesthesia. Several near falls but no falls.  PRECAUTIONS: Fall  SUBJECTIVE: Patient has migraine this morning, pretty severe at this point this morning. Dizziness is also increased this morning at baseline, 6/10. Almost cancelled appointment, but decided to come.   PAIN:  Are you having pain? Yes: NPRS scale: 7/10 Pain location: Anterior/Lateral Aspect of Head Pain description:  Migraine Aggravating factors: Unsure; woke up with migraine this morning.  Relieving factors: Medication   OBJECTIVE:  VESTIBULAR TREATMENT:  Gaze Adaptation: x1 Viewing Horizontal: Position: Seated, Time: 30 seconds, 45 seconds, Reps: 2, and Comment: moderate dizziness at baseline, no increase this date, mild blurred vision. Reports hx of visual auras during migraine, therefore could contribute to increased vision changes.  and x1 Viewing Vertical:  Position: Seated, Time: 30 seconds, Reps: 2, and Comment: 7/10 dizziness after completion, mild blurred vision and difficulty maintaining eyes focused on target.  Increased time between reps due to symptoms this date. Despite extended rest break, dimmed light and quiet room during session, patient had increased migraine symptoms with activity and requested to end session earlier   PATIENT EDUCATION: Education details: Continue HEP as able Person educated: Patient Education method: Explanation and Verbal cues Education comprehension: verbalized understanding   HOME EXERCISE PROGRAM: Access Code: [email or web address]   GOALS: Goals reviewed with patient? Yes   SHORT TERM GOALS: Target date: 02/09/2023   Pt will be independent with HEP in order to improve balance and decrease dizziness symptoms in order to decrease fall risk and improve function at home and work. Baseline: no HEP issued on 01/12/23 Goal status: INITIAL   LONG TERM GOALS: Target date: 04/06/2023   Patient will have improved FOTO score of 6 points or greater in order to demonstrate improvements in patient's ADLs and functional performance.  Baseline: to be performed next session Goal status: INITIAL   2.  Patient will demonstrate reduced falls risk as evidenced by Dynamic Gait Index (DGI) >19/24. Baseline:  Goal status: INITIAL   3. Patient will have demonstrate decreased falls risk as indicated by Activities Specific Balance Confidence Scale score of 80% or greater. Baseline:  scored 72.5% on 01/12/23 Goal status: INITIAL   4.  Patient will reduce perceived disability to low levels as indicated by <40 on Dizziness Handicap Inventory. Baseline: scored 68/100 on 01/12/23;  Goal status: INITIAL   5.  Patient will report 50% or greater improvement in their symptoms of dizziness and imbalance with provoking motions or positions  Baseline: 01/12/23 pt reports dizziness and imbalance with quick turns, body turn and head turning activities;  Goal status: INITIAL     ASSESSMENT:   CLINICAL IMPRESSION: Today's skilled PT session limited due to patient's tolerance, patient presented with severe migraine symptoms including photophobia/phonophobia. Attempted gaze stabilization with dim lighting and quiet room, patient still having significant symptoms limiting tolerance, pt requesting to end session earlier. Patient agreeable due to symptoms. Will continue to benefit from skilled PT services to progress toward all STG/LTGs.   OBJECTIVE IMPAIRMENTS: decreased balance, difficulty walking, decreased ROM, dizziness, impaired sensation, and impaired vision/preception.    ACTIVITY LIMITATIONS: bending, bathing, hygiene/grooming, and crafts-painting, reading   PARTICIPATION LIMITATIONS: cleaning, laundry, interpersonal relationship, driving, community activity, occupation, and hobbies   PERSONAL FACTORS: Time since onset of injury/illness/exacerbation and 3+ comorbidities: chronic intractable headaches, migraines, cervicalgia, neuropathy  are also affecting patient's functional outcome.    REHAB POTENTIAL: Fair secondary to migraines   CLINICAL DECISION MAKING: Evolving/moderate complexity   EVALUATION COMPLEXITY: Moderate  PLAN:   PT FREQUENCY: 1-2x/week   PT DURATION: 12 weeks   PLANNED INTERVENTIONS: Therapeutic exercises, Therapeutic activity, Neuromuscular re-education, Balance training, Gait training, Patient/Family education, Self Care, Vestibular training,  Canalith repositioning, Visual/preceptual remediation/compensation, and Manual therapy  PLAN FOR NEXT SESSION: continue habituation to bending, motion sensitivity activities. VOR cancellation or visual tracking.    Creed Copper Fairly, PT, DPT 01/28/23 9:11 AM

## 2023-02-02 ENCOUNTER — Ambulatory Visit: Payer: Medicare Other

## 2023-02-02 DIAGNOSIS — R2681 Unsteadiness on feet: Secondary | ICD-10-CM

## 2023-02-02 DIAGNOSIS — R42 Dizziness and giddiness: Secondary | ICD-10-CM | POA: Diagnosis not present

## 2023-02-02 NOTE — Therapy (Signed)
OUTPATIENT PHYSICAL THERAPY VESTIBULAR TREATMENT NOTE   Patient Name: Danielle Tran MRN: [id number] DOB:01-05-1976, 47 y.o., female Today's Date: 02/02/2023  PCP: Barbette Reichmann, MD  REFERRING PROVIDER: Lonell Face, MD   END OF SESSION:  PT End of Session - 02/02/23 0851     Visit Number 5    Number of Visits 13    Date for PT Re-Evaluation 04/06/23    PT Start Time 0850   Pt arrived late   PT Stop Time 0929    PT Time Calculation (min) 39 min    Activity Tolerance Other (comment)   limited due to tolerance (dizziness/migraine)   Behavior During Therapy WFL for tasks assessed/performed             Past Medical History:  Diagnosis Date   Arthritis    Back pain    Corneal dystrophy    GERD (gastroesophageal reflux disease)    Headache    Hypertension    Past Surgical History:  Procedure Laterality Date   ABDOMINAL HYSTERECTOMY     BACK SURGERY     CARPAL TUNNEL RELEASE Bilateral    COLONOSCOPY N/A 01/13/2022   Procedure: COLONOSCOPY;  Surgeon: [name], MD;  Location: [facility] ENDOSCOPY;  Service: Endoscopy;  Laterality: N/A;   ESOPHAGOGASTRODUODENOSCOPY N/A 01/13/2022   Procedure: ESOPHAGOGASTRODUODENOSCOPY (EGD);  Surgeon: [name], MD;  Location: [facility] ENDOSCOPY;  Service: Endoscopy;  Laterality: N/A;   ESOPHAGOGASTRODUODENOSCOPY (EGD) WITH PROPOFOL N/A 09/01/2022   Procedure: ESOPHAGOGASTRODUODENOSCOPY (EGD) WITH PROPOFOL;  Surgeon: [name], MD;  Location: [facility] ENDOSCOPY;  Service: Endoscopy;  Laterality: N/A;   EYE SURGERY Left    partial cornea transplant x5   REDUCTION MAMMAPLASTY Bilateral 1994   Patient Active Problem List   Diagnosis Date Noted   Intractable headache 07/18/2015    ONSET DATE: 2 years ago  REFERRING DIAG: H81.10 (ICD-10-CM) - Benign paroxysmal positional vertigo   THERAPY DIAG:  Dizziness and giddiness  Unsteadiness on feet  Rationale for Evaluation and Treatment: Rehabilitation  PERTINENT  HISTORY: Pt states that she was diagnoses with tinnitus about 2 years ago. Then, she started getting migraines and they said she had a seizure. Pt states after that she states she "can't keep balance" and has "watery head/wobbly head". 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 Pt states she saw an ENT and she said they looked in her ears and sent her to the neurologist. Pt reports she has not had a VNG test. Pt states she is still having intense migraines and states she can go 5-6 days with one. Pt states she has tried different medicines. Pt states she has to do 1 room at a time with sweeping or mopping and she needs to rest. Pt reports she drives. Pt is on disability.    Per neurology MD note 10/26/22, complaint of positional vertigo with forward bending - causes severe vertigo, lasts 45 - 60 seconds. This is chronic for her and previously noted. Also triggered by sit to stand. Occurs every day. Now syncope, loss of conciousness, falls. Denies new neurologic symptoms. Denies weakness, numbness, tingling, visual changes (blurry vision, vision loss, double vision), change in headache (headaches are unchanged in character/quality, severity). Increased pressure with sneezing/coughing brief frontal sharp pressure, this is new as of this week. Balance, coordination well maintained. Chronic low back pain, unchanged. Neck pain, chronic, unchanged. Bowel/bladder control well maintained without incontinence or saddle anesthesia.  Several near falls but no falls.  PRECAUTIONS: Fall  SUBJECTIVE: Patient reports having a better day today. No migraine currently. Mild dizziness at baseline. No other new changes.   PAIN:  Are you having pain? No   OBJECTIVE:  VESTIBULAR TREATMENT:  Gaze Adaptation: x1 Viewing Horizontal: Position: Seated, Time: 45 secs, 60 secs, Reps: 2, and Comment: moderate dizziness at baseline, no increase  this date, mild blurred vision.   and x1 Viewing Vertical:  Position: Seated, Time: 45 secs, 60 secs, Reps: 2, and Comment: 5/10 dizziness after completion, mild blurred vision.  Educated on progressing time with HEP. Short rest breaks intermittently between reps. Continued educaiton on self monitoring of symptoms.   Habituation:  Standing Position: Completed bending to target with recall, and then turns to L to target with recall, completed x 2 lines on letter chart with moderate to severe dizziness. Then changed direction and completed forward bending to target and then turns to R to target x 2 lines. Moderate dizziness. Short rest break required. Increased symptoms noted with body turns > bending, and as well to Left Direction > Right Direction. Educated on added to HEP and completing with more functional activities.   Completed bouncing on blue therapy ball with focal point for otolith organ simulation, moderate-severe dizziness with spinning component reported. Completed 3 x 30 seconds, with rest breaks due to symptoms requiring increased time for completion.   PATIENT EDUCATION: Education details: HEP progression/additions.  Person educated: Patient Education method: Explanation and Verbal cues Education comprehension: verbalized understanding   HOME EXERCISE PROGRAM: Access Code: [email or web address]   GOALS: Goals reviewed with patient? Yes   SHORT TERM GOALS: Target date: 02/09/2023   Pt will be independent with HEP in order to improve balance and decrease dizziness symptoms in order to decrease fall risk and improve function at home and work. Baseline: no HEP issued on 01/12/23 Goal status: INITIAL   LONG TERM GOALS: Target date: 04/06/2023   Patient will have improved FOTO score of 6 points or greater in order to demonstrate improvements in patient's ADLs and functional performance.  Baseline: to be performed next session Goal status: INITIAL   2.  Patient will demonstrate reduced falls  risk as evidenced by Dynamic Gait Index (DGI) >19/24. Baseline:  Goal status: INITIAL   3. Patient will have demonstrate decreased falls risk as indicated by Activities Specific Balance Confidence Scale score of 80% or greater. Baseline: scored 72.5% on 01/12/23 Goal status: INITIAL   4.  Patient will reduce perceived disability to low levels as indicated by <40 on Dizziness Handicap Inventory. Baseline: scored 68/100 on 01/12/23;  Goal status: INITIAL   5.  Patient will report 50% or greater improvement in their symptoms of dizziness and imbalance with provoking motions or positions  Baseline: 01/12/23 pt reports dizziness and imbalance with quick turns, body turn and head turning activities;  Goal status: INITIAL     ASSESSMENT:   CLINICAL IMPRESSION: Today's skilled PT session focused on continued progression of VOR/Gaze Stabilization with plain background. Pt able to tolerate progression to 60 seconds well today. Continued habituation to vertical displacement and with bending/quarter body turns with patient mod-severe dizziness intermittent require rest breaks. Educated on HEP progression/additions to continue to work on activity tolerance and motion sensitivity. Patient is making steady progress, will continue to benefit from skilled PT services to address impairments.    OBJECTIVE IMPAIRMENTS: decreased balance, difficulty walking, decreased ROM, dizziness, impaired sensation, and impaired vision/preception.    ACTIVITY LIMITATIONS: bending,  bathing, hygiene/grooming, and crafts-painting, reading   PARTICIPATION LIMITATIONS: cleaning, laundry, interpersonal relationship, driving, community activity, occupation, and hobbies   PERSONAL FACTORS: Time since onset of injury/illness/exacerbation and 3+ comorbidities: chronic intractable headaches, migraines, cervicalgia, neuropathy  are also affecting patient's functional outcome.    REHAB POTENTIAL: Fair secondary to migraines    CLINICAL DECISION MAKING: Evolving/moderate complexity   EVALUATION COMPLEXITY: Moderate     PLAN:   PT FREQUENCY: 1-2x/week   PT DURATION: 12 weeks   PLANNED INTERVENTIONS: Therapeutic exercises, Therapeutic activity, Neuromuscular re-education, Balance training, Gait training, Patient/Family education, Self Care, Vestibular training, Canalith repositioning, Visual/preceptual remediation/compensation, and Manual therapy  PLAN FOR NEXT SESSION: VOR with standing/balance component or conflicting background. continue habituation to bending, motion sensitivity activities. Bouncing on ball w/ focal target. Walking with turns. VOR cancellation or visual tracking.    Creed Copper Fairly, PT, DPT 02/02/23 11:17 AM

## 2023-02-04 ENCOUNTER — Ambulatory Visit: Payer: Medicare Other

## 2023-02-04 DIAGNOSIS — R42 Dizziness and giddiness: Secondary | ICD-10-CM

## 2023-02-04 DIAGNOSIS — R2681 Unsteadiness on feet: Secondary | ICD-10-CM

## 2023-02-04 NOTE — Therapy (Signed)
OUTPATIENT PHYSICAL THERAPY VESTIBULAR TREATMENT NOTE   Patient Name: Danielle Tran MRN: [id number] DOB:05/12/1975, 47 y.o., female Today's Date: 02/04/2023  PCP: Barbette Reichmann, MD  REFERRING PROVIDER: Lonell Face, MD   END OF SESSION:  PT End of Session - 02/04/23 0806     Visit Number 6    Number of Visits 13    Date for PT Re-Evaluation 04/06/23    PT Start Time 0806   Pt arrived late   PT Stop Time 0846    PT Time Calculation (min) 40 min    Activity Tolerance Other (comment)   limited due to tolerance (dizziness/migraine)   Behavior During Therapy WFL for tasks assessed/performed             Past Medical History:  Diagnosis Date   Arthritis    Back pain    Corneal dystrophy    GERD (gastroesophageal reflux disease)    Headache    Hypertension    Past Surgical History:  Procedure Laterality Date   ABDOMINAL HYSTERECTOMY     BACK SURGERY     CARPAL TUNNEL RELEASE Bilateral    COLONOSCOPY N/A 01/13/2022   Procedure: COLONOSCOPY;  Surgeon: [name], MD;  Location: [facility] ENDOSCOPY;  Service: Endoscopy;  Laterality: N/A;   ESOPHAGOGASTRODUODENOSCOPY N/A 01/13/2022   Procedure: ESOPHAGOGASTRODUODENOSCOPY (EGD);  Surgeon: [name], MD;  Location: [facility] ENDOSCOPY;  Service: Endoscopy;  Laterality: N/A;   ESOPHAGOGASTRODUODENOSCOPY (EGD) WITH PROPOFOL N/A 09/01/2022   Procedure: ESOPHAGOGASTRODUODENOSCOPY (EGD) WITH PROPOFOL;  Surgeon: [name], MD;  Location: [facility] ENDOSCOPY;  Service: Endoscopy;  Laterality: N/A;   EYE SURGERY Left    partial cornea transplant x5   REDUCTION MAMMAPLASTY Bilateral 1994   Patient Active Problem List   Diagnosis Date Noted   Intractable headache 07/18/2015    ONSET DATE: 2 years ago  REFERRING DIAG: H81.10 (ICD-10-CM) - Benign paroxysmal positional vertigo   THERAPY DIAG:  Dizziness and giddiness  Unsteadiness on feet  Rationale for Evaluation and Treatment: Rehabilitation  PERTINENT  HISTORY: Pt states that she was diagnoses with tinnitus about 2 years ago. Then, she started getting migraines and they said she had a seizure. Pt states after that she states she "can't keep balance" and has "watery head/wobbly head". 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 Pt states she saw an ENT and she said they looked in her ears and sent her to the neurologist. Pt reports she has not had a VNG test. Pt states she is still having intense migraines and states she can go 5-6 days with one. Pt states she has tried different medicines. Pt states she has to do 1 room at a time with sweeping or mopping and she needs to rest. Pt reports she drives. Pt is on disability.    Per neurology MD note 10/26/22, complaint of positional vertigo with forward bending - causes severe vertigo, lasts 45 - 60 seconds. This is chronic for her and previously noted. Also triggered by sit to stand. Occurs every day. Now syncope, loss of conciousness, falls. Denies new neurologic symptoms. Denies weakness, numbness, tingling, visual changes (blurry vision, vision loss, double vision), change in headache (headaches are unchanged in character/quality, severity). Increased pressure with sneezing/coughing brief frontal sharp pressure, this is new as of this week. Balance, coordination well maintained. Chronic low back pain, unchanged. Neck pain, chronic, unchanged. Bowel/bladder control well maintained without incontinence or saddle anesthesia.  Several near falls but no falls.  PRECAUTIONS: Fall  SUBJECTIVE: Patient reports had some sensation of dizziness getting off the elevator.   PAIN:  Are you having pain? No   OBJECTIVE:  VESTIBULAR TREATMENT:  Gaze Adaptation: x1 Viewing Horizontal: Position: Standing, Time: 60 secs, Reps: 2, and Comment: conflicting/pattern background, mild dizziness   and x1 Viewing Vertical:  Position:  Standing, Time: 60 secs, Reps: 2, and Comment: conflicting/pattern background; 5/10 dizziness after completion, mild blurred vision.  Educated on progressing at home, to standing with feet hip width and adding conflicting background. Therapist provided patient with checkered background to ensure compliance.  Short rest breaks intermittently between reps. Continued educaiton on self monitoring of symptoms.   Habituation:  In hallway, completed forward ambulation with 90 deg turns to R/L x 5 reps each direction. 5-6 steps between turns. More symptomatic with turns to L > R. Mild unsteadiness intermittent and mild-mod dizziness.   Completed bouncing on blue therapy ball with focal point for otolith organ simulation, mild dizziness with progression of time this date. Completed x 30 seconds, x 60 seconds. Then x 60 seconds with patterned background. Moderate dizziness. Then progressed by completed bouncing on blue therapy ball (no patterned background) with horizontal head turns x 5 reps each direction. Moderate Dizziness. Most symptomatic with head movement this date. Patient reporting specifically to L > R. Intermittent breaks due to symptoms requiring increased time for completion.   PATIENT EDUCATION: Education details: HEP progression/additions (see patient instructions)  Person educated: Patient Education method: Explanation and Verbal cues Education comprehension: verbalized understanding   HOME EXERCISE PROGRAM: Access Code: [email or web address]   GOALS: Goals reviewed with patient? Yes   SHORT TERM GOALS: Target date: 02/09/2023   Pt will be independent with HEP in order to improve balance and decrease dizziness symptoms in order to decrease fall risk and improve function at home and work. Baseline: no HEP issued on 01/12/23 Goal status: INITIAL   LONG TERM GOALS: Target date: 04/06/2023   Patient will have improved FOTO score of 6 points or greater in order to demonstrate improvements in patient's  ADLs and functional performance.  Baseline: to be performed next session Goal status: INITIAL   2.  Patient will demonstrate reduced falls risk as evidenced by Dynamic Gait Index (DGI) >19/24. Baseline:  Goal status: INITIAL   3. Patient will have demonstrate decreased falls risk as indicated by Activities Specific Balance Confidence Scale score of 80% or greater. Baseline: scored 72.5% on 01/12/23 Goal status: INITIAL   4.  Patient will reduce perceived disability to low levels as indicated by <40 on Dizziness Handicap Inventory. Baseline: scored 68/100 on 01/12/23;  Goal status: INITIAL   5.  Patient will report 50% or greater improvement in their symptoms of dizziness and imbalance with provoking motions or positions  Baseline: 01/12/23 pt reports dizziness and imbalance with quick turns, body turn and head turning activities;  Goal status: INITIAL     ASSESSMENT:   CLINICAL IMPRESSION: Today's skilled PT session focused on continued progression of VOR/Gaze Stabilization with patterned background and standing position. As well as progressing habituation to full body 90 deg turns, and then with bouncing with horizontal head turns. Patient tolerating well with intermittent rest breaks for symptoms improvement/resolution. Continued progression of HEP for home to patient's tolerance. Will continue per POC.    OBJECTIVE IMPAIRMENTS: decreased balance, difficulty walking, decreased ROM, dizziness, impaired sensation, and impaired vision/preception.    ACTIVITY LIMITATIONS: bending, bathing, hygiene/grooming, and crafts-painting, reading  PARTICIPATION LIMITATIONS: cleaning, laundry, interpersonal relationship, driving, community activity, occupation, and hobbies   PERSONAL FACTORS: Time since onset of injury/illness/exacerbation and 3+ comorbidities: chronic intractable headaches, migraines, cervicalgia, neuropathy  are also affecting patient's functional outcome.    REHAB POTENTIAL:  Fair secondary to migraines   CLINICAL DECISION MAKING: Evolving/moderate complexity   EVALUATION COMPLEXITY: Moderate     PLAN:   PT FREQUENCY: 1-2x/week   PT DURATION: 12 weeks   PLANNED INTERVENTIONS: Therapeutic exercises, Therapeutic activity, Neuromuscular re-education, Balance training, Gait training, Patient/Family education, Self Care, Vestibular training, Canalith repositioning, Visual/preceptual remediation/compensation, and Manual therapy  PLAN FOR NEXT SESSION: VOR with standing/balance component or conflicting background. continue habituation to bending, motion sensitivity activities. Bouncing on ball w/ focal target. Walking with turns. VOR cancellation or visual tracking.    Creed Copper Fairly, PT, DPT 02/04/23 8:52 AM

## 2023-02-04 NOTE — Patient Instructions (Signed)
Gaze Stabilization progress to standing position with patterned/checkered background, 60 seconds.    Bouncing on Ball with X Target, x 30-60 seconds. Then progress to bouncing with target, adding in horizontal head turns to R and L x 5 reps each direction or to tolerance.    Ambulating 5-6 steps, then turning to R or L (alternate direction). Completed 2 sets.

## 2023-02-09 ENCOUNTER — Ambulatory Visit: Payer: Medicare Other

## 2023-02-09 DIAGNOSIS — R42 Dizziness and giddiness: Secondary | ICD-10-CM | POA: Diagnosis not present

## 2023-02-09 DIAGNOSIS — R2681 Unsteadiness on feet: Secondary | ICD-10-CM

## 2023-02-09 NOTE — Therapy (Signed)
OUTPATIENT PHYSICAL THERAPY VESTIBULAR TREATMENT NOTE   Patient Name: Danielle Tran MRN: [id number] DOB:1976/01/18, 47 y.o., female Today's Date: 02/09/2023  PCP: Barbette Reichmann, MD  REFERRING PROVIDER: Lonell Face, MD   END OF SESSION:  PT End of Session - 02/09/23 0851     Visit Number 7    Number of Visits 13    Date for PT Re-Evaluation 04/06/23    PT Start Time 0851   Pt arrived late   PT Stop Time 0930    PT Time Calculation (min) 39 min    Activity Tolerance Other (comment)   limited due to tolerance (dizziness/migraine)   Behavior During Therapy WFL for tasks assessed/performed             Past Medical History:  Diagnosis Date   Arthritis    Back pain    Corneal dystrophy    GERD (gastroesophageal reflux disease)    Headache    Hypertension    Past Surgical History:  Procedure Laterality Date   ABDOMINAL HYSTERECTOMY     BACK SURGERY     CARPAL TUNNEL RELEASE Bilateral    COLONOSCOPY N/A 01/13/2022   Procedure: COLONOSCOPY;  Surgeon: [name], MD;  Location: [facility] ENDOSCOPY;  Service: Endoscopy;  Laterality: N/A;   ESOPHAGOGASTRODUODENOSCOPY N/A 01/13/2022   Procedure: ESOPHAGOGASTRODUODENOSCOPY (EGD);  Surgeon: [name], MD;  Location: [facility] ENDOSCOPY;  Service: Endoscopy;  Laterality: N/A;   ESOPHAGOGASTRODUODENOSCOPY (EGD) WITH PROPOFOL N/A 09/01/2022   Procedure: ESOPHAGOGASTRODUODENOSCOPY (EGD) WITH PROPOFOL;  Surgeon: [name], MD;  Location: [facility] ENDOSCOPY;  Service: Endoscopy;  Laterality: N/A;   EYE SURGERY Left    partial cornea transplant x5   REDUCTION MAMMAPLASTY Bilateral 1994   Patient Active Problem List   Diagnosis Date Noted   Intractable headache 07/18/2015    ONSET DATE: 2 years ago  REFERRING DIAG: H81.10 (ICD-10-CM) - Benign paroxysmal positional vertigo   THERAPY DIAG:  Dizziness and giddiness  Unsteadiness on feet  Rationale for Evaluation and Treatment: Rehabilitation  PERTINENT  HISTORY: Pt states that she was diagnoses with tinnitus about 2 years ago. Then, she started getting migraines and they said she had a seizure. Pt states after that she states she "can't keep balance" and has "watery head/wobbly head". 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 Pt states she saw an ENT and she said they looked in her ears and sent her to the neurologist. Pt reports she has not had a VNG test. Pt states she is still having intense migraines and states she can go 5-6 days with one. Pt states she has tried different medicines. Pt states she has to do 1 room at a time with sweeping or mopping and she needs to rest. Pt reports she drives. Pt is on disability.    Per neurology MD note 10/26/22, complaint of positional vertigo with forward bending - causes severe vertigo, lasts 45 - 60 seconds. This is chronic for her and previously noted. Also triggered by sit to stand. Occurs every day. Now syncope, loss of conciousness, falls. Denies new neurologic symptoms. Denies weakness, numbness, tingling, visual changes (blurry vision, vision loss, double vision), change in headache (headaches are unchanged in character/quality, severity). Increased pressure with sneezing/coughing brief frontal sharp pressure, this is new as of this week. Balance, coordination well maintained. Chronic low back pain, unchanged. Neck pain, chronic, unchanged. Bowel/bladder control well maintained without incontinence or saddle anesthesia.  Several near falls but no falls.  PRECAUTIONS: Fall  SUBJECTIVE: Patient reports fatigue from traveling over the weekend. Had some dizziness riding in the car but very minimal. Patient reports had two migraines over the weekend. None currently.  PAIN:  Are you having pain? No   OBJECTIVE:  VESTIBULAR TREATMENT:  Gaze Adaptation: x1 Viewing Horizontal: Position: Standing Narrow BOS, Time: 60  secs, Reps: 2, and Comment: conflicting/pattern background, mild dizziness, prgression of head movement with increased symptoms  and x1 Viewing Vertical:  Position: Standing Narrow BOS, Time: 60 secs, Reps: 2, and Comment: conflicting/pattern background; mild dizziness, progressed to increased speed of head movement. Mod Dizziness    Habituation:  In hallway, completed forward ambulation with 90 deg turns to R/L x 5 reps each direction. 4-5 steps between turns, with less duration between turns as progression for increased frequency. Pt continues to be more symptomatic with turns to L > R, however improvement with only mild dizziness this date compared to prior session.   Visual Tracking: Completed ambulation with visual tracking, including 80' with vertical. Minimal symptoms with pure vertical. Then completed 57' with diagonal, and then ball toss with visual tracking x 80'. More symptoms with diagonal noted and head movements to L side. Mild - Mod dizziness, short rest breaks provided intermittently.   Completed bouncing on blue therapy ball with focal point for otolith organ simulation, progressed by completed bouncing on blue therapy ball (no patterned background) with horizontal head turns x 5 reps each direction, and then vertical head turns x 5 reps, completed x 2 sets reducing rest break time between sets. Mild Dizziness. Then completed bouncing with eyes closed (imaginary target) x 60 seconds, with mild dizziness upon completion. Then eyes closed and head turns x 5 reps with mild dizziness.   PATIENT EDUCATION: Education details: Continue HEP Person educated: Patient Education method: Leisure centre manager cues Education comprehension: verbalized understanding   HOME EXERCISE PROGRAM: Access Code: [email or web address]   GOALS: Goals reviewed with patient? Yes   SHORT TERM GOALS: Target date: 02/09/2023   Pt will be independent with HEP in order to improve balance and decrease dizziness symptoms in  order to decrease fall risk and improve function at home and work. Baseline: no HEP issued on 01/12/23: Reports IND with updated HEP Goal status: MET   LONG TERM GOALS: Target date: 04/06/2023   Patient will have improved FOTO score of 6 points or greater in order to demonstrate improvements in patient's ADLs and functional performance.  Baseline: to be performed next session Goal status: INITIAL   2.  Patient will demonstrate reduced falls risk as evidenced by Dynamic Gait Index (DGI) >19/24. Baseline:  Goal status: INITIAL   3. Patient will have demonstrate decreased falls risk as indicated by Activities Specific Balance Confidence Scale score of 80% or greater. Baseline: scored 72.5% on 01/12/23 Goal status: INITIAL   4.  Patient will reduce perceived disability to low levels as indicated by <40 on Dizziness Handicap Inventory. Baseline: scored 68/100 on 01/12/23;  Goal status: INITIAL   5.  Patient will report 50% or greater improvement in their symptoms of dizziness and imbalance with provoking motions or positions  Baseline: 01/12/23 pt reports dizziness and imbalance with quick turns, body turn and head turning activities;  Goal status: INITIAL     ASSESSMENT:   CLINICAL IMPRESSION: Today's skilled PT session included assessment of patient's progress toward STGs. Patient able to meet all STGs. Continued progression of visual tracking, gaze stabilization and activities  that challenge vestibular input with activity. Mild - Mod dizziness this date, with improvements with rest breaks. Patient is making steady progress with therapy and will continue to benefit from skilled PT services to progress toward LTGs.     OBJECTIVE IMPAIRMENTS: decreased balance, difficulty walking, decreased ROM, dizziness, impaired sensation, and impaired vision/preception.    ACTIVITY LIMITATIONS: bending, bathing, hygiene/grooming, and crafts-painting, reading   PARTICIPATION LIMITATIONS: cleaning,  laundry, interpersonal relationship, driving, community activity, occupation, and hobbies   PERSONAL FACTORS: Time since onset of injury/illness/exacerbation and 3+ comorbidities: chronic intractable headaches, migraines, cervicalgia, neuropathy  are also affecting patient's functional outcome.    REHAB POTENTIAL: Fair secondary to migraines   CLINICAL DECISION MAKING: Evolving/moderate complexity   EVALUATION COMPLEXITY: Moderate     PLAN:   PT FREQUENCY: 1-2x/week   PT DURATION: 12 weeks   PLANNED INTERVENTIONS: Therapeutic exercises, Therapeutic activity, Neuromuscular re-education, Balance training, Gait training, Patient/Family education, Self Care, Vestibular training, Canalith repositioning, Visual/preceptual remediation/compensation, and Manual therapy  PLAN FOR NEXT SESSION: Add in balance component. VOR with standing/balance component or conflicting background. continue habituation to bending, motion sensitivity activities. Bouncing on ball w/ focal target. Walking with turns. VOR cancellation or visual tracking.    Howie Ill, PT, DPT 02/09/23 12:30 PM

## 2023-02-11 ENCOUNTER — Ambulatory Visit: Payer: Medicare Other

## 2023-02-16 ENCOUNTER — Ambulatory Visit: Payer: Medicare Other

## 2023-02-18 ENCOUNTER — Ambulatory Visit: Payer: Medicare Other

## 2023-02-18 DIAGNOSIS — R2681 Unsteadiness on feet: Secondary | ICD-10-CM

## 2023-02-18 DIAGNOSIS — R42 Dizziness and giddiness: Secondary | ICD-10-CM | POA: Diagnosis not present

## 2023-02-18 NOTE — Therapy (Signed)
OUTPATIENT PHYSICAL THERAPY VESTIBULAR TREATMENT NOTE   Patient Name: Danielle Tran MRN: [id number] DOB:[birth year], 47 y.o., female Today's Date: 02/18/2023  PCP: Barbette Reichmann, MD  REFERRING PROVIDER: Lonell Face, MD   END OF SESSION:  PT End of Session - 02/18/23 0753     Visit Number 8    Number of Visits 13    Date for PT Re-Evaluation 04/06/23    Authorization Type [employer] Medicare    Progress Note Due on Visit 10    PT Start Time 0800    PT Stop Time 0845    PT Time Calculation (min) 45 min    Activity Tolerance Patient tolerated treatment well    Behavior During Therapy WFL for tasks assessed/performed             Past Medical History:  Diagnosis Date   Arthritis    Back pain    Corneal dystrophy    GERD (gastroesophageal reflux disease)    Headache    Hypertension    Past Surgical History:  Procedure Laterality Date   ABDOMINAL HYSTERECTOMY     BACK SURGERY     CARPAL TUNNEL RELEASE Bilateral    COLONOSCOPY N/A 01/13/2022   Procedure: COLONOSCOPY;  Surgeon: [name], MD;  Location: [facility] ENDOSCOPY;  Service: Endoscopy;  Laterality: N/A;   ESOPHAGOGASTRODUODENOSCOPY N/A 01/13/2022   Procedure: ESOPHAGOGASTRODUODENOSCOPY (EGD);  Surgeon: [name], MD;  Location: [facility] ENDOSCOPY;  Service: Endoscopy;  Laterality: N/A;   ESOPHAGOGASTRODUODENOSCOPY (EGD) WITH PROPOFOL N/A 09/01/2022   Procedure: ESOPHAGOGASTRODUODENOSCOPY (EGD) WITH PROPOFOL;  Surgeon: [name], MD;  Location: [facility] ENDOSCOPY;  Service: Endoscopy;  Laterality: N/A;   EYE SURGERY Left    partial cornea transplant x5   REDUCTION MAMMAPLASTY Bilateral 1994   Patient Active Problem List   Diagnosis Date Noted   Intractable headache 07/18/2015    ONSET DATE: 2 years ago  REFERRING DIAG: H81.10 (ICD-10-CM) - Benign paroxysmal positional vertigo   THERAPY DIAG:  Dizziness and giddiness  Unsteadiness on feet  Rationale for Evaluation and Treatment:  Rehabilitation  PERTINENT HISTORY: Pt states that she was diagnoses with tinnitus about 2 years ago. Then, she started getting migraines and they said she had a seizure. Pt states after that she states she "can't keep balance" and has "watery head/wobbly head". 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 Pt states she saw an ENT and she said they looked in her ears and sent her to the neurologist. Pt reports she has not had a VNG test. Pt states she is still having intense migraines and states she can go 5-6 days with one. Pt states she has tried different medicines. Pt states she has to do 1 room at a time with sweeping or mopping and she needs to rest. Pt reports she drives. Pt is on disability.    Per neurology MD note 10/26/22, complaint of positional vertigo with forward bending - causes severe vertigo, lasts 45 - 60 seconds. This is chronic for her and previously noted. Also triggered by sit to stand. Occurs every day. Now syncope, loss of conciousness, falls. Denies new neurologic symptoms. Denies weakness, numbness, tingling, visual changes (blurry vision, vision loss, double vision), change in headache (headaches are unchanged in character/quality, severity). Increased pressure with sneezing/coughing brief frontal sharp pressure, this is new as of this week. Balance, coordination well maintained. Chronic low back pain, unchanged. Neck pain, chronic, unchanged. Bowel/bladder  control well maintained without incontinence or saddle anesthesia. Several near falls but no falls.  PRECAUTIONS: Fall  SUBJECTIVE: Patient reports multiple migraines in last week; had follow up with MD. Noted that she was not taking BP medication due to insurance coverage. Realized BP was elevated and is feeling better now that started medication. No dizziness currently at baseline.   PAIN:  Are you having pain? No   OBJECTIVE:   VESTIBULAR TREATMENT:  Neuro Re-Ed:  Gaze Adaptation: x1 Viewing Horizontal: Position: Standing Narrow BOS, Time: 60 secs, Reps: 1, and Comment: conflicting/pattern background, mild dizziness, prgression of head movement with increased symptoms, mild dizziness  and x1 Viewing Vertical:  Position: Standing Narrow BOS, Time: 60 secs, Reps: 1, and Comment: conflicting/pattern background; mild dizziness, progressed to increased speed of head movement. Mod Dizziness    VOR x 1 with Ambulation: Completed forward ambulation approx 20' with Horizontal/Vertical VOR x 2 reps each direction. Mild - Mod Dizziness, Mild unsteadiness but no assist required.   Habituation:  In hallway, completed short distance forward ambulation (approx 10') and bending to random target (use of blaze pods random technique) with quarter and 90 deg turns to R/L. Completed x 2 trials, 60 seconds each trial, with 2 second delay. Patient with more symptomatic with 90 deg turn vs. Quarter. Mild - Moderate Dizziness.   Ambulation with Head Turns: Completed forward ambulation at patient's self selected gait speed, with horizontal head turn (small body movement) to R/L to locate target. Completed 50' x 4 reps, moderate dizziness.   Standing on Airex:   EC 2 x 30 seconds feet hip width, progressing to Narrow BOS x 30 seconds. Increased postural sway. Minimal Dizziness.   EC feet hip width with horizontal/vertical head turns x 10 reps each. Mild sway. Minimal Dizziness.     PATIENT EDUCATION: Education details: HEP Review Person educated: Patient Education method: Explanation and Verbal cues Education comprehension: verbalized understanding   HOME EXERCISE PROGRAM: Access Code: [email or web address]   GOALS: Goals reviewed with patient? Yes   SHORT TERM GOALS: Target date: 02/09/2023   Pt will be independent with HEP in order to improve balance and decrease dizziness symptoms in order to decrease fall risk and improve function at home  and work. Baseline: no HEP issued on 01/12/23: Reports IND with updated HEP Goal status: MET   LONG TERM GOALS: Target date: 04/06/2023   Patient will have improved FOTO score of 6 points or greater in order to demonstrate improvements in patient's ADLs and functional performance.  Baseline: to be performed next session Goal status: INITIAL   2.  Patient will demonstrate reduced falls risk as evidenced by Dynamic Gait Index (DGI) >19/24. Baseline:  Goal status: INITIAL   3. Patient will have demonstrate decreased falls risk as indicated by Activities Specific Balance Confidence Scale score of 80% or greater. Baseline: scored 72.5% on 01/12/23 Goal status: INITIAL   4.  Patient will reduce perceived disability to low levels as indicated by <40 on Dizziness Handicap Inventory. Baseline: scored 68/100 on 01/12/23;  Goal status: INITIAL   5.  Patient will report 50% or greater improvement in their symptoms of dizziness and imbalance with provoking motions or positions  Baseline: 01/12/23 pt reports dizziness and imbalance with quick turns, body turn and head turning activities;  Goal status: INITIAL     ASSESSMENT:   CLINICAL IMPRESSION: Continued session focused on progression of VOR activities and habituation to head/body movement. Patient tolerating progression of activities well. Still symptomatic with 90  deg turns but intensity of dizziness improved. Patient verbalizes improvements in symptoms since starting therapy and pleased with progress. Reviewed HEP and began to add in balance activities during session. Will continue to progress patient toward all LTGs.     OBJECTIVE IMPAIRMENTS: decreased balance, difficulty walking, decreased ROM, dizziness, impaired sensation, and impaired vision/preception.    ACTIVITY LIMITATIONS: bending, bathing, hygiene/grooming, and crafts-painting, reading   PARTICIPATION LIMITATIONS: cleaning, laundry, interpersonal relationship, driving, community  activity, occupation, and hobbies   PERSONAL FACTORS: Time since onset of injury/illness/exacerbation and 3+ comorbidities: chronic intractable headaches, migraines, cervicalgia, neuropathy  are also affecting patient's functional outcome.    REHAB POTENTIAL: Fair secondary to migraines   CLINICAL DECISION MAKING: Evolving/moderate complexity   EVALUATION COMPLEXITY: Moderate     PLAN:   PT FREQUENCY: 1-2x/week   PT DURATION: 12 weeks   PLANNED INTERVENTIONS: Therapeutic exercises, Therapeutic activity, Neuromuscular re-education, Balance training, Gait training, Patient/Family education, Self Care, Vestibular training, Canalith repositioning, Visual/preceptual remediation/compensation, and Manual therapy  PLAN FOR NEXT SESSION: contineu to add in balance component. VOR with ambulation, motion sensitivity activities. Bouncing on ball w/ focal target. Walking with turns. VOR cancellation or visual tracking.    Creed Copper Fairly, PT, DPT 02/18/23 9:20 AM

## 2023-02-23 ENCOUNTER — Ambulatory Visit: Payer: Medicare Other

## 2023-02-23 DIAGNOSIS — R42 Dizziness and giddiness: Secondary | ICD-10-CM | POA: Diagnosis not present

## 2023-02-23 DIAGNOSIS — R2681 Unsteadiness on feet: Secondary | ICD-10-CM

## 2023-02-23 NOTE — Therapy (Signed)
OUTPATIENT PHYSICAL THERAPY VESTIBULAR TREATMENT NOTE   Patient Name: Danielle Tran MRN: [id number] DOB:Oct 19, 1975, 47 y.o., female Today's Date: 02/23/2023  PCP: Barbette Reichmann, MD  REFERRING PROVIDER: Lonell Face, MD   END OF SESSION:  PT End of Session - 02/23/23 0810     Visit Number 9    Number of Visits 13    Date for PT Re-Evaluation 04/06/23    Authorization Type [employer] Medicare    Progress Note Due on Visit 10    PT Start Time [employer] desk did not check in patient.   PT Stop Time 0845    PT Time Calculation (min) 36 min    Activity Tolerance Patient tolerated treatment well    Behavior During Therapy WFL for tasks assessed/performed              Past Medical History:  Diagnosis Date   Arthritis    Back pain    Corneal dystrophy    GERD (gastroesophageal reflux disease)    Headache    Hypertension    Past Surgical History:  Procedure Laterality Date   ABDOMINAL HYSTERECTOMY     BACK SURGERY     CARPAL TUNNEL RELEASE Bilateral    COLONOSCOPY N/A 01/13/2022   Procedure: COLONOSCOPY;  Surgeon: [name], MD;  Location: [facility] ENDOSCOPY;  Service: Endoscopy;  Laterality: N/A;   ESOPHAGOGASTRODUODENOSCOPY N/A 01/13/2022   Procedure: ESOPHAGOGASTRODUODENOSCOPY (EGD);  Surgeon: [name], MD;  Location: [facility] ENDOSCOPY;  Service: Endoscopy;  Laterality: N/A;   ESOPHAGOGASTRODUODENOSCOPY (EGD) WITH PROPOFOL N/A 09/01/2022   Procedure: ESOPHAGOGASTRODUODENOSCOPY (EGD) WITH PROPOFOL;  Surgeon: [name], MD;  Location: [facility] ENDOSCOPY;  Service: Endoscopy;  Laterality: N/A;   EYE SURGERY Left    partial cornea transplant x5   REDUCTION MAMMAPLASTY Bilateral 1994   Patient Active Problem List   Diagnosis Date Noted   Intractable headache 07/18/2015    ONSET DATE: 2 years ago  REFERRING DIAG: H81.10 (ICD-10-CM) - Benign paroxysmal positional vertigo   THERAPY DIAG:  Dizziness and giddiness  Unsteadiness on  feet  Rationale for Evaluation and Treatment: Rehabilitation  PERTINENT HISTORY: Pt states that she was diagnoses with tinnitus about 2 years ago. Then, she started getting migraines and they said she had a seizure. Pt states after that she states she "can't keep balance" and has "watery head/wobbly head". 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 Pt states she saw an ENT and she said they looked in her ears and sent her to the neurologist. Pt reports she has not had a VNG test. Pt states she is still having intense migraines and states she can go 5-6 days with one. Pt states she has tried different medicines. Pt states she has to do 1 room at a time with sweeping or mopping and she needs to rest. Pt reports she drives. Pt is on disability.    Per neurology MD note 10/26/22, complaint of positional vertigo with forward bending - causes severe vertigo, lasts 45 - 60 seconds. This is chronic for her and previously noted. Also triggered by sit to stand. Occurs every day. Now syncope, loss of conciousness, falls. Denies new neurologic symptoms. Denies weakness, numbness, tingling, visual changes (blurry vision, vision loss, double vision), change in headache (headaches are unchanged in character/quality, severity). Increased pressure with sneezing/coughing brief frontal sharp pressure, this is new as of this week. Balance, coordination well maintained. Chronic  low back pain, unchanged. Neck pain, chronic, unchanged. Bowel/bladder control well maintained without incontinence or saddle anesthesia. Several near falls but no falls.  PRECAUTIONS: Fall  SUBJECTIVE: Patient reports some back pain after the weekend. No migraines. No dizziness at baseline.   PAIN:  Are you having pain? No   OBJECTIVE:  VESTIBULAR TREATMENT:  Neuro Re-Ed:  Gaze Adaptation: VOR x 1 with Ambulation: Completed forward ambulation approx 20'  with Horizontal/Vertical VOR x 4 reps each direction. Mild - Mod Dizziness, Mild unsteadiness but no assist required. Trialed with patterned background vs increased speed to further challenge patient. More challenge noted with increased walking speed. Educated to progress head speed with HEP  Standing on Airex:   EC 3 x 30 seconds with Narrow BOS. Increased postural sway. Minimal Dizziness.   EC feet hip width with horizontal/vertical head turns x 10 reps each. Mild sway. Minimal Dizziness.   Standing on Airex, completed anterior/posterior stepping strategy x 5 reps; then with continue stepping strategy with addition of horizontal/vertical head turns x 10 reps. Increased challenge with SLS on LLE.   On Airex: Tandem Stance, 2 x 30 seconds alternating foot position   Rockerboard (positioned A/P): focus on holding steadying initially without head movement, 2 x 30 seconds working toward reduced UE support. Then completed steady with addition of horizontal head turn, progressing from light tactile input to no UE support. Mild dizziness reported with full head movement.     PATIENT EDUCATION: Education details: Continue HEP Person educated: Patient Education method: Leisure centre manager cues Education comprehension: verbalized understanding   HOME EXERCISE PROGRAM: Access Code: [email or web address]   GOALS: Goals reviewed with patient? Yes   SHORT TERM GOALS: Target date: 02/09/2023   Pt will be independent with HEP in order to improve balance and decrease dizziness symptoms in order to decrease fall risk and improve function at home and work. Baseline: no HEP issued on 01/12/23: Reports IND with updated HEP Goal status: MET   LONG TERM GOALS: Target date: 04/06/2023   Patient will have improved FOTO score of 6 points or greater in order to demonstrate improvements in patient's ADLs and functional performance.  Baseline: to be performed next session Goal status: INITIAL   2.  Patient will  demonstrate reduced falls risk as evidenced by Dynamic Gait Index (DGI) >19/24. Baseline:  Goal status: INITIAL   3. Patient will have demonstrate decreased falls risk as indicated by Activities Specific Balance Confidence Scale score of 80% or greater. Baseline: scored 72.5% on 01/12/23 Goal status: INITIAL   4.  Patient will reduce perceived disability to low levels as indicated by <40 on Dizziness Handicap Inventory. Baseline: scored 68/100 on 01/12/23;  Goal status: INITIAL   5.  Patient will report 50% or greater improvement in their symptoms of dizziness and imbalance with provoking motions or positions  Baseline: 01/12/23 pt reports dizziness and imbalance with quick turns, body turn and head turning activities;  Goal status: INITIAL     ASSESSMENT:   CLINICAL IMPRESSION: Today's session focused on more addition of high level balance activities, with addition of complaint surface and rockerboard. More challenge noted with tandem stance, vision removed, and head movement on complaint surface. Increased challenge with single leg stance on LLE > RLE. Overall minimal dizziness with activities today demonstrating improved tolerance. Progressed head speed of VOR with ambulation. Will continue per POC.     OBJECTIVE IMPAIRMENTS: decreased balance, difficulty walking, decreased ROM, dizziness, impaired sensation, and impaired vision/preception.    ACTIVITY  LIMITATIONS: bending, bathing, hygiene/grooming, and crafts-painting, reading   PARTICIPATION LIMITATIONS: cleaning, laundry, interpersonal relationship, driving, community activity, occupation, and hobbies   PERSONAL FACTORS: Time since onset of injury/illness/exacerbation and 3+ comorbidities: chronic intractable headaches, migraines, cervicalgia, neuropathy  are also affecting patient's functional outcome.    REHAB POTENTIAL: Fair secondary to migraines   CLINICAL DECISION MAKING: Evolving/moderate complexity   EVALUATION  COMPLEXITY: Moderate     PLAN:   PT FREQUENCY: 1-2x/week   PT DURATION: 12 weeks   PLANNED INTERVENTIONS: Therapeutic exercises, Therapeutic activity, Neuromuscular re-education, Balance training, Gait training, Patient/Family education, Self Care, Vestibular training, Canalith repositioning, Visual/preceptual remediation/compensation, and Manual therapy  PLAN FOR NEXT SESSION: Dynamic Balance (add balance to HEP with foam/rockerboard). VOR with ambulation (increased head speed), motion sensitivity activities. Bouncing on ball w/ focal target. Walking with turns. VOR cancellation or visual tracking.    Creed Copper Fairly, PT, DPT 02/23/23 8:48 AM

## 2023-02-25 ENCOUNTER — Ambulatory Visit: Payer: Medicare Other

## 2023-02-25 DIAGNOSIS — R42 Dizziness and giddiness: Secondary | ICD-10-CM

## 2023-02-25 DIAGNOSIS — R2681 Unsteadiness on feet: Secondary | ICD-10-CM

## 2023-02-25 NOTE — Therapy (Signed)
OUTPATIENT PHYSICAL THERAPY VESTIBULAR TREATMENT NOTE/PROGRESS NOTE   Patient Name: Danielle Tran MRN: [id number] DOB:[birth year], 47 y.o., female Today's Date: 02/25/2023  PCP: Barbette Reichmann, MD  REFERRING PROVIDER: Lonell Face, MD   Physical Therapy Progress Note   Dates of Reporting Period: 01/12/2023 - 02/25/2023  See Note below for Objective Data and Assessment of Progress/Goals.  Thank you for the referral of this patient. Adelfa Koh, PT, DPT   END OF SESSION:  PT End of Session - 02/25/23 0802     Visit Number 10    Number of Visits 13    Date for PT Re-Evaluation 04/06/23    Authorization Type [employer] Medicare    Progress Note Due on Visit 10    PT Start Time 0802    PT Stop Time 0844    PT Time Calculation (min) 42 min    Activity Tolerance Patient tolerated treatment well    Behavior During Therapy WFL for tasks assessed/performed              Past Medical History:  Diagnosis Date   Arthritis    Back pain    Corneal dystrophy    GERD (gastroesophageal reflux disease)    Headache    Hypertension    Past Surgical History:  Procedure Laterality Date   ABDOMINAL HYSTERECTOMY     BACK SURGERY     CARPAL TUNNEL RELEASE Bilateral    COLONOSCOPY N/A 01/13/2022   Procedure: COLONOSCOPY;  Surgeon: [name], MD;  Location: [facility] ENDOSCOPY;  Service: Endoscopy;  Laterality: N/A;   ESOPHAGOGASTRODUODENOSCOPY N/A 01/13/2022   Procedure: ESOPHAGOGASTRODUODENOSCOPY (EGD);  Surgeon: [name], MD;  Location: [facility] ENDOSCOPY;  Service: Endoscopy;  Laterality: N/A;   ESOPHAGOGASTRODUODENOSCOPY (EGD) WITH PROPOFOL N/A 09/01/2022   Procedure: ESOPHAGOGASTRODUODENOSCOPY (EGD) WITH PROPOFOL;  Surgeon: [name], MD;  Location: [facility] ENDOSCOPY;  Service: Endoscopy;  Laterality: N/A;   EYE SURGERY Left    partial cornea transplant x5   REDUCTION MAMMAPLASTY Bilateral 1994   Patient Active Problem List   Diagnosis Date Noted    Intractable headache 07/18/2015    ONSET DATE: 2 years ago  REFERRING DIAG: H81.10 (ICD-10-CM) - Benign paroxysmal positional vertigo   THERAPY DIAG:  Dizziness and giddiness  Unsteadiness on feet  Rationale for Evaluation and Treatment: Rehabilitation  PERTINENT HISTORY: Pt states that she was diagnoses with tinnitus about 2 years ago. Then, she started getting migraines and they said she had a seizure. Pt states after that she states she "can't keep balance" and has "watery head/wobbly head". 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 Pt states she saw an ENT and she said they looked in her ears and sent her to the neurologist. Pt reports she has not had a VNG test. Pt states she is still having intense migraines and states she can go 5-6 days with one. Pt states she has tried different medicines. Pt states she has to do 1 room at a time with sweeping or mopping and she needs to rest. Pt reports she drives. Pt is on disability.    Per neurology MD note 10/26/22, complaint of positional vertigo with forward bending - causes severe vertigo, lasts 45 - 60 seconds. This is chronic for her and previously noted. Also triggered by sit to stand. Occurs every day. Now syncope, loss of conciousness, falls. Denies new neurologic symptoms. Denies weakness, numbness, tingling, visual changes (blurry vision, vision  loss, double vision), change in headache (headaches are unchanged in character/quality, severity). Increased pressure with sneezing/coughing brief frontal sharp pressure, this is new as of this week. Balance, coordination well maintained. Chronic low back pain, unchanged. Neck pain, chronic, unchanged. Bowel/bladder control well maintained without incontinence or saddle anesthesia. Several near falls but no falls.  PRECAUTIONS: Fall  SUBJECTIVE: Patient reports she had a significant migraine, reports was sick  on her stomach. Could not tolerate any light/sound. Still have some residual dizziness, 1/10.   PAIN:  Are you having pain? No   OBJECTIVE:  VESTIBULAR TREATMENT:  Neuro Re-Ed:  Completed bouncing on blue therapy ball with focal point for otolith organ simulation, completed bouncing on blue therapy ball (no patterned background) with Horizontal VOR x 45 seconds and then with Vertical VOR x 45  seconds, completed x 2 sets reducing rest break time between sets. Mild Dizziness. Educated on continued completion, further progression of activity onto trampoline. Due to symptoms/recent migraine will hold off on progression of exercise until next session.   Backwards Walking: With eyes open, completed backwards ambulation, 2 x 50'. No unsteadiness noted, slow gait noted. Tandem Walking: forward tandem ambulation, completed 2 x 30' with close supervision, mild unsteadiness initially but improvements noted with increased repetition.  Gait with Head Turns: Forward ambulation with horizontal/vertical head turns, 2 x 50' each, more challenge with vertical > horizontal noted with mild unsteadiness. Short rest break between completion)  Gait with Vision Removed: Forward ambulation with vision removed, without support on L side patient demo mild gradual veering to L > R. No overt LOB but general mild veer. Educated on vestibular input into these activities and effect on balance.   Standing on Firm Surface: with targets positioned in diagonal directions, completed alternating bend/reach to various directions with preference to diagonal, x 10 reps each Then alternating direction. Patient increased symptoms (mild dizziness) with bending down to touch loewr target vs. Reaching upward. L direction continue to be slightly increased to R, but still have symptoms on R side.    PATIENT EDUCATION: Education details: Continue HEP Person educated: Patient Education method: Leisure centre manager cues Education  comprehension: verbalized understanding   HOME EXERCISE PROGRAM: Access Code: [email or web address]   GOALS: Goals reviewed with patient? Yes   SHORT TERM GOALS: Target date: 02/09/2023   Pt will be independent with HEP in order to improve balance and decrease dizziness symptoms in order to decrease fall risk and improve function at home and work. Baseline: no HEP issued on 01/12/23: Reports IND with updated HEP Goal status: MET   LONG TERM GOALS: Target date: 04/06/2023   Patient will have improved FOTO score of 6 points or greater in order to demonstrate improvements in patient's ADLs and functional performance.  Baseline: to be performed next session Goal status: INITIAL   2.  Patient will demonstrate reduced falls risk as evidenced by Dynamic Gait Index (DGI) >19/24. Baseline:  Goal status: INITIAL   3. Patient will have demonstrate decreased falls risk as indicated by Activities Specific Balance Confidence Scale score of 80% or greater. Baseline: scored 72.5% on 01/12/23 Goal status: INITIAL   4.  Patient will reduce perceived disability to low levels as indicated by <40 on Dizziness Handicap Inventory. Baseline: scored 68/100 on 01/12/23;  Goal status: INITIAL   5.  Patient will report 50% or greater improvement in their symptoms of dizziness and imbalance with provoking motions or positions  Baseline: 01/12/23 pt reports dizziness and imbalance with quick turns, body  turn and head turning activities;  Goal status: INITIAL     ASSESSMENT:   CLINICAL IMPRESSION: Patient is making steady progress with PT services. Patient continues to tolerate activities well, as we progress gaze stabilization, activities that stimulate vestibular system and high level balance. Patient continues to demo some mild L hypofunction. Patient is progressing toward all LTGs. Patient agreeable to trial to decrease frequency to 1x/week for more self management of symptoms with updated HEP. Will continue to progress  toward all LTGs.     OBJECTIVE IMPAIRMENTS: decreased balance, difficulty walking, decreased ROM, dizziness, impaired sensation, and impaired vision/preception.    ACTIVITY LIMITATIONS: bending, bathing, hygiene/grooming, and crafts-painting, reading   PARTICIPATION LIMITATIONS: cleaning, laundry, interpersonal relationship, driving, community activity, occupation, and hobbies   PERSONAL FACTORS: Time since onset of injury/illness/exacerbation and 3+ comorbidities: chronic intractable headaches, migraines, cervicalgia, neuropathy  are also affecting patient's functional outcome.    REHAB POTENTIAL: Fair secondary to migraines   CLINICAL DECISION MAKING: Evolving/moderate complexity   EVALUATION COMPLEXITY: Moderate     PLAN:   PT FREQUENCY: 1-2x/week   PT DURATION: 12 weeks   PLANNED INTERVENTIONS: Therapeutic exercises, Therapeutic activity, Neuromuscular re-education, Balance training, Gait training, Patient/Family education, Self Care, Vestibular training, Canalith repositioning, Visual/preceptual remediation/compensation, and Manual therapy  PLAN FOR NEXT SESSION: Bouncing on Trampoline? Dynamic Balance (add balance to HEP with foam/rockerboard). VOR with ambulation (increased head speed), motion sensitivity activities. Walking with turns. VOR cancellation or visual tracking.    Creed Copper Fairly, PT, DPT 02/25/23 8:51 AM

## 2023-03-02 ENCOUNTER — Ambulatory Visit: Payer: Medicare Other | Attending: Neurology

## 2023-03-02 ENCOUNTER — Telehealth: Payer: Self-pay

## 2023-03-02 DIAGNOSIS — R2681 Unsteadiness on feet: Secondary | ICD-10-CM | POA: Insufficient documentation

## 2023-03-02 DIAGNOSIS — R42 Dizziness and giddiness: Secondary | ICD-10-CM | POA: Insufficient documentation

## 2023-03-02 NOTE — Telephone Encounter (Signed)
  Patient scheduled for PT appointment at 8am, patient no showed. PT called patient to check in, however unable to get answer from patient this date/time. Will re-attempt and inform of next scheduled appointment.   Creed Copper Fairly, PT, DPT 03/02/23 8:16 AM

## 2023-03-04 ENCOUNTER — Ambulatory Visit: Payer: Medicare Other

## 2023-03-09 ENCOUNTER — Ambulatory Visit: Payer: Medicare Other

## 2023-03-18 ENCOUNTER — Ambulatory Visit: Payer: Medicare Other

## 2023-03-18 DIAGNOSIS — R2681 Unsteadiness on feet: Secondary | ICD-10-CM

## 2023-03-18 DIAGNOSIS — R42 Dizziness and giddiness: Secondary | ICD-10-CM

## 2023-03-18 NOTE — Therapy (Signed)
OUTPATIENT PHYSICAL THERAPY VESTIBULAR TREATMENT NOTE   Patient Name: Danielle Tran MRN: [id number] DOB:03/04/1976, 47 y.o., female Today's Date: 03/18/2023  PCP: Barbette Reichmann, MD  REFERRING PROVIDER: Lonell Face, MD    END OF SESSION:  PT End of Session - 03/18/23 0802     Visit Number 11    Number of Visits 13    Date for PT Re-Evaluation 04/06/23    Authorization Type [employer] Medicare    Progress Note Due on Visit 10    PT Start Time 0802    PT Stop Time 0844    PT Time Calculation (min) 42 min    Activity Tolerance Patient tolerated treatment well    Behavior During Therapy WFL for tasks assessed/performed              Past Medical History:  Diagnosis Date   Arthritis    Back pain    Corneal dystrophy    GERD (gastroesophageal reflux disease)    Headache    Hypertension    Past Surgical History:  Procedure Laterality Date   ABDOMINAL HYSTERECTOMY     BACK SURGERY     CARPAL TUNNEL RELEASE Bilateral    COLONOSCOPY N/A 01/13/2022   Procedure: COLONOSCOPY;  Surgeon: [name], MD;  Location: [facility] ENDOSCOPY;  Service: Endoscopy;  Laterality: N/A;   ESOPHAGOGASTRODUODENOSCOPY N/A 01/13/2022   Procedure: ESOPHAGOGASTRODUODENOSCOPY (EGD);  Surgeon: [name], MD;  Location: [facility] ENDOSCOPY;  Service: Endoscopy;  Laterality: N/A;   ESOPHAGOGASTRODUODENOSCOPY (EGD) WITH PROPOFOL N/A 09/01/2022   Procedure: ESOPHAGOGASTRODUODENOSCOPY (EGD) WITH PROPOFOL;  Surgeon: [name], MD;  Location: [facility] ENDOSCOPY;  Service: Endoscopy;  Laterality: N/A;   EYE SURGERY Left    partial cornea transplant x5   REDUCTION MAMMAPLASTY Bilateral 1994   Patient Active Problem List   Diagnosis Date Noted   Intractable headache 07/18/2015    ONSET DATE: 2 years ago  REFERRING DIAG: H81.10 (ICD-10-CM) - Benign paroxysmal positional vertigo   THERAPY DIAG:  Dizziness and giddiness  Unsteadiness on feet  Rationale for Evaluation and Treatment:  Rehabilitation  PERTINENT HISTORY: Pt states that she was diagnoses with tinnitus about 2 years ago. Then, she started getting migraines and they said she had a seizure. Pt states after that she states she "can't keep balance" and has "watery head/wobbly head". 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 Pt states she saw an ENT and she said they looked in her ears and sent her to the neurologist. Pt reports she has not had a VNG test. Pt states she is still having intense migraines and states she can go 5-6 days with one. Pt states she has tried different medicines. Pt states she has to do 1 room at a time with sweeping or mopping and she needs to rest. Pt reports she drives. Pt is on disability.    Per neurology MD note 10/26/22, complaint of positional vertigo with forward bending - causes severe vertigo, lasts 45 - 60 seconds. This is chronic for her and previously noted. Also triggered by sit to stand. Occurs every day. Now syncope, loss of conciousness, falls. Denies new neurologic symptoms. Denies weakness, numbness, tingling, visual changes (blurry vision, vision loss, double vision), change in headache (headaches are unchanged in character/quality, severity). Increased pressure with sneezing/coughing brief frontal sharp pressure, this is new as of this week. Balance, coordination well maintained. Chronic low back pain, unchanged. Neck pain, chronic,  unchanged. Bowel/bladder control well maintained without incontinence or saddle anesthesia. Several near falls but no falls.  PRECAUTIONS: Fall  SUBJECTIVE: Patient reports that she has had some very severe migraines over the last 2-3 weeks. Unsure of any triggers that may have stirred it up. Reports she was couped up in bed for a while. Pt went to [employer] and with the visual scanning and head movement, reports heaviness of her head and unstable/wobbly.  Reports she has felt unsteady.    PAIN:  Are you having pain? Yes: NPRS scale: 1/10 Pain location: Head Pain description: Pressure;Headache  OBJECTIVE:  VESTIBULAR TREATMENT:  Gaze Adaptation: VOR x 1 Horizontal: Standing, Plain Background, 60 seconds,  2 reps. Slowed pace d/t symptoms. Mild - moderate dizziness. Intermittent rest breaks.   VOR x 1 Vertical: Standing, Plain Background, 60 seconds,  2 reps. Slowed pace d/t symptoms. Mild - moderate dizziness. Intermittent rest breaks.    Standing Horizontal Head Turns: Standing feet apart on firm surface, completed horizontal head turns to various targets (x 8 reps), mild symptoms. Mild postural sway. Standing Diagonal Head Turns: Standing feet apart on firm surface, completed diagonal head turns to various targets (x 8 reps), moderate symptoms. Mild postural sway.  Neuro Re-Ed:    Standing Balance: Surface: [employer] Position: Narrow Base of Support Feet Hip Width Apart Completed with: Eyes Closed with Feet Hip Width, 2 x 30 seconds, increased postural sway.  Then Narrow BOS and with Eyes Open; Head Turns x 5 Reps and Head Nods x 5 Reps; completed x 2 sets. Mild - Moderate Dizziness.     PATIENT EDUCATION: Education details: Continue HEP; HEP Modification Based Upon Symptoms.  Person educated: Patient Education method: Explanation and Verbal cues Education comprehension: verbalized understanding   HOME EXERCISE PROGRAM: Access Code: [email or web address]   GOALS: Goals reviewed with patient? Yes   SHORT TERM GOALS: Target date: 02/09/2023   Pt will be independent with HEP in order to improve balance and decrease dizziness symptoms in order to decrease fall risk and improve function at home and work. Baseline: no HEP issued on 01/12/23: Reports IND with updated HEP Goal status: MET   LONG TERM GOALS: Target date: 04/06/2023   Patient will have improved FOTO score of 6 points or greater in order to demonstrate improvements in patient's ADLs  and functional performance.  Baseline: to be performed next session Goal status: INITIAL   2.  Patient will demonstrate reduced falls risk as evidenced by Dynamic Gait Index (DGI) >19/24. Baseline:  Goal status: INITIAL   3. Patient will have demonstrate decreased falls risk as indicated by Activities Specific Balance Confidence Scale score of 80% or greater. Baseline: scored 72.5% on 01/12/23 Goal status: INITIAL   4.  Patient will reduce perceived disability to low levels as indicated by <40 on Dizziness Handicap Inventory. Baseline: scored 68/100 on 01/12/23;  Goal status: INITIAL   5.  Patient will report 50% or greater improvement in their symptoms of dizziness and imbalance with provoking motions or positions  Baseline: 01/12/23 pt reports dizziness and imbalance with quick turns, body turn and head turning activities;  Goal status: INITIAL     ASSESSMENT:   CLINICAL IMPRESSION: Patient presents to therapy after 2 week absences due to frequent and severe migraines, limiting patient's tolerance fro functional activities. Patient continue to have mild headaches/migraines, and able to tolerate some therapy activities. Initiated VOR gaze stabilization, habituation, and balance again with degrading task to allow for improved tolerance. Will continue to progress  to patient's tolerance and per POC.     OBJECTIVE IMPAIRMENTS: decreased balance, difficulty walking, decreased ROM, dizziness, impaired sensation, and impaired vision/preception.    ACTIVITY LIMITATIONS: bending, bathing, hygiene/grooming, and crafts-painting, reading   PARTICIPATION LIMITATIONS: cleaning, laundry, interpersonal relationship, driving, community activity, occupation, and hobbies   PERSONAL FACTORS: Time since onset of injury/illness/exacerbation and 3+ comorbidities: chronic intractable headaches, migraines, cervicalgia, neuropathy  are also affecting patient's functional outcome.    REHAB POTENTIAL: Fair  secondary to migraines   CLINICAL DECISION MAKING: Evolving/moderate complexity   EVALUATION COMPLEXITY: Moderate     PLAN:   PT FREQUENCY: 1-2x/week   PT DURATION: 12 weeks   PLANNED INTERVENTIONS: Therapeutic exercises, Therapeutic activity, Neuromuscular re-education, Balance training, Gait training, Patient/Family education, Self Care, Vestibular training, Canalith repositioning, Visual/preceptual remediation/compensation, and Manual therapy  PLAN FOR NEXT SESSION:  Dynamic Balance (add balance to HEP with foam/rockerboard). VOR with ambulation (increased head speed), motion sensitivity activities. Walking with turns. VOR cancellation or visual tracking.    Howie Ill, PT, DPT 03/18/23 11:45 AM

## 2023-03-23 ENCOUNTER — Ambulatory Visit: Payer: Medicare Other

## 2023-03-30 ENCOUNTER — Ambulatory Visit: Payer: Medicare Other | Attending: Neurology

## 2023-03-30 DIAGNOSIS — R42 Dizziness and giddiness: Secondary | ICD-10-CM | POA: Diagnosis present

## 2023-03-30 DIAGNOSIS — R2681 Unsteadiness on feet: Secondary | ICD-10-CM | POA: Diagnosis present

## 2023-03-30 NOTE — Therapy (Signed)
OUTPATIENT PHYSICAL THERAPY VESTIBULAR TREATMENT NOTE/DISCHARGE SUMMARY   Patient Name: Danielle Tran MRN: [id number] DOB:Aug 28, 1975, 47 y.o., female Today's Date: 03/30/2023  PCP: Barbette Reichmann, MD  REFERRING PROVIDER: Lonell Face, MD   PHYSICAL THERAPY DISCHARGE SUMMARY  Visits from Start of Care: 12  Current functional level related to goals / functional outcomes: See Clinical Impression Statement   Remaining deficits: Dizziness/Migraines    Education / Equipment: HEP provided; Option to return to PT when feel like migraines are better controlled.    Patient agrees to discharge. Patient goals were partially met. Patient is being discharged due to the patient's request. Patient requesting to be discharged from PT services due to recent 4-5 weeks of uncontrolled migraines and associated symptoms limiting participation and tolerance for PT services.    END OF SESSION:  PT End of Session - 03/30/23 1146     Visit Number 12    Number of Visits 13    Date for PT Re-Evaluation 04/06/23    Authorization Type [employer]    Progress Note Due on Visit 10    PT Start Time 1146    PT Stop Time 1225    PT Time Calculation (min) 39 min    Activity Tolerance Patient tolerated treatment well    Behavior During Therapy WFL for tasks assessed/performed              Past Medical History:  Diagnosis Date   Arthritis    Back pain    Corneal dystrophy    GERD (gastroesophageal reflux disease)    Headache    Hypertension    Past Surgical History:  Procedure Laterality Date   ABDOMINAL HYSTERECTOMY     BACK SURGERY     CARPAL TUNNEL RELEASE Bilateral    COLONOSCOPY N/A 01/13/2022   Procedure: COLONOSCOPY;  Surgeon: [name], MD;  Location: [facility] ENDOSCOPY;  Service: Endoscopy;  Laterality: N/A;   ESOPHAGOGASTRODUODENOSCOPY N/A 01/13/2022   Procedure: ESOPHAGOGASTRODUODENOSCOPY (EGD);  Surgeon: [name], MD;  Location: [facility] ENDOSCOPY;  Service:  Endoscopy;  Laterality: N/A;   ESOPHAGOGASTRODUODENOSCOPY (EGD) WITH PROPOFOL N/A 09/01/2022   Procedure: ESOPHAGOGASTRODUODENOSCOPY (EGD) WITH PROPOFOL;  Surgeon: [name], MD;  Location: [facility] ENDOSCOPY;  Service: Endoscopy;  Laterality: N/A;   EYE SURGERY Left    partial cornea transplant x5   REDUCTION MAMMAPLASTY Bilateral 1994   Patient Active Problem List   Diagnosis Date Noted   Intractable headache 07/18/2015    ONSET DATE: 2 years ago  REFERRING DIAG: H81.10 (ICD-10-CM) - Benign paroxysmal positional vertigo   THERAPY DIAG:  Dizziness and giddiness  Unsteadiness on feet  Rationale for Evaluation and Treatment: Rehabilitation  PERTINENT HISTORY: Pt states that she was diagnoses with tinnitus about 2 years ago. Then, she started getting migraines and they said she had a seizure. Pt states after that she states she "can't keep balance" and has "watery head/wobbly head". 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 Pt states she saw an ENT and she said they looked in her ears and sent her to the neurologist. Pt reports she has not had a VNG test. Pt states she is still having intense migraines and states she can go 5-6 days with one. Pt states she has tried different medicines. Pt states she has to do 1 room at a time with sweeping or mopping and she needs to rest. Pt reports she drives. Pt is on  disability.    Per neurology MD note 10/26/22, complaint of positional vertigo with forward bending - causes severe vertigo, lasts 45 - 60 seconds. This is chronic for her and previously noted. Also triggered by sit to stand. Occurs every day. Now syncope, loss of conciousness, falls. Denies new neurologic symptoms. Denies weakness, numbness, tingling, visual changes (blurry vision, vision loss, double vision), change in headache (headaches are unchanged in character/quality, severity). Increased  pressure with sneezing/coughing brief frontal sharp pressure, this is new as of this week. Balance, coordination well maintained. Chronic low back pain, unchanged. Neck pain, chronic, unchanged. Bowel/bladder control well maintained without incontinence or saddle anesthesia. Several near falls but no falls.  PRECAUTIONS: Fall  SUBJECTIVE: Patient reports that she has still not feeling well, has had migraines that have kept her in bed. Reports she hasn't had any relief from any medication.   PAIN:  Are you having pain? Yes: NPRS scale: 4/10 Pain location: Head Pain description: Pressure;Headache  OBJECTIVE:   [facility] PT Assessment - 03/30/23 0001       Standardized Balance Assessment   Standardized Balance Assessment Dynamic Gait Index      Dynamic Gait Index   Level Surface Normal    Change in Gait Speed Normal    Gait with Horizontal Head Turns Mild Impairment    Gait with Vertical Head Turns Mild Impairment    Gait and Pivot Turn Mild Impairment    Step Over Obstacle Mild Impairment    Step Around Obstacles Normal    Steps Mild Impairment    Total Score 19    DGI comment: 19/24            ABC Scale: 75% (higher % = more confidence in balance with activities Dizziness Handicap Inventory: 82/100 (Higher Score = Greater Perceived Disability)   Neuro Re-Ed:  Verbal Review of HEP, and incorporation of functional activities with HEP. Patient requesting to discharge from PT services at this time due to significant increase in Migraines over the last 4-5 weeks limiting tolerance for participation in vestibular therapy. PT verbalizing understanding and encouraged to follow up with Neurology regarding increase in symptoms. Encouraged to return to PT services once migraines are under control and feel can better tolerate vestibular PT services. Pt verbalized understanding.     PATIENT EDUCATION: Education details: Progress toward LTGS; Discharge This Visit; Plan to return to PT if can  tolerate.  Person educated: Patient Education method: Explanation and Verbal cues Education comprehension: verbalized understanding   HOME EXERCISE PROGRAM: Access Code: [email or web address]   GOALS: Goals reviewed with patient? Yes   SHORT TERM GOALS: Target date: 02/09/2023   Pt will be independent with HEP in order to improve balance and decrease dizziness symptoms in order to decrease fall risk and improve function at home and work. Baseline: no HEP issued on 01/12/23: Reports IND with updated HEP Goal status: MET   LONG TERM GOALS: Target date: 04/06/2023   Patient will have improved FOTO score of 6 points or greater in order to demonstrate improvements in patient's ADLs and functional performance.  Baseline: to be performed next session; FOTO not captured on Evaluation therefore not able to reassess.  Goal status: DEFERRED   2.  Patient will demonstrate reduced falls risk as evidenced by Dynamic Gait Index (DGI) >/= 19/24. Baseline: 16/24 on 9/24; 19/24 Goal status: MET   3. Patient will have demonstrate decreased falls risk as indicated by Activities Specific Balance Confidence Scale score of 80% or greater. Baseline: scored  72.5% on 01/12/23; 75% on 03/30/23 Goal status: MET    4.  Patient will reduce perceived disability to low levels as indicated by <40 on Dizziness Handicap Inventory. Baseline: scored 68/100 on 01/12/23; 82/100 on 03/30/23 Goal status: NOT MET   5.  Patient will report 50% or greater improvement in their symptoms of dizziness and imbalance with provoking motions or positions  Baseline: 01/12/23 pt reports dizziness and imbalance with quick turns, body turn and head turning activities; still continue to report dizziness with activities over last few weeks due to increased migraine severity and frequency.  Goal status: NOT MET     ASSESSMENT:   CLINICAL IMPRESSION: Today's skilled PT session focused on assessment of patient's progress toward LTGs. Patient able to  meet LTG #2 and #3 demonstrating improved balance on DGI, improving to 19/24. As well as improved subjective balance and confidence as noted on ABC Scale. However due to recent increase in severity and frequency of migraines, patient having significant increase in dizziness symptoms therefore unable to meet rest of LTGs. Patient is requesting to be discharged from PT services due to recent 4-5 weeks of uncontrolled migraines and associated symptoms limiting participation and tolerance for PT services. Educated on continued HEP completion to tolerance and incorporating functional activities. Patient verbalized understanding. Encouraged return to PT services once patient and MD feel that neurologists are adequately controlled to allow for improved tolerance for PT sessions.     OBJECTIVE IMPAIRMENTS: decreased balance, difficulty walking, decreased ROM, dizziness, impaired sensation, and impaired vision/preception.    ACTIVITY LIMITATIONS: bending, bathing, hygiene/grooming, and crafts-painting, reading   PARTICIPATION LIMITATIONS: cleaning, laundry, interpersonal relationship, driving, community activity, occupation, and hobbies   PERSONAL FACTORS: Time since onset of injury/illness/exacerbation and 3+ comorbidities: chronic intractable headaches, migraines, cervicalgia, neuropathy  are also affecting patient's functional outcome.    REHAB POTENTIAL: Fair secondary to migraines   CLINICAL DECISION MAKING: Evolving/moderate complexity   EVALUATION COMPLEXITY: Moderate     PLAN:   PT FREQUENCY: 1-2x/week   PT DURATION: 12 weeks   PLANNED INTERVENTIONS: Therapeutic exercises, Therapeutic activity, Neuromuscular re-education, Balance training, Gait training, Patient/Family education, Self Care, Vestibular training, Canalith repositioning, Visual/preceptual remediation/compensation, and Manual therapy  PLAN FOR NEXT SESSION:  d/c this visit.    Howie Ill, PT, DPT 03/30/23 12:29  PM

## 2023-04-01 ENCOUNTER — Ambulatory Visit: Payer: Medicare Other

## 2023-08-18 ENCOUNTER — Emergency Department
Admission: EM | Admit: 2023-08-18 | Discharge: 2023-08-18 | Disposition: A | Discharge status: Home / Self Care | Attending: Emergency Medicine | Admitting: Emergency Medicine

## 2023-08-18 ENCOUNTER — Other Ambulatory Visit: Payer: Self-pay

## 2023-08-18 DIAGNOSIS — I10 Essential (primary) hypertension: Secondary | ICD-10-CM | POA: Insufficient documentation

## 2023-08-18 DIAGNOSIS — S61211A Laceration without foreign body of left index finger without damage to nail, initial encounter: Secondary | ICD-10-CM | POA: Insufficient documentation

## 2023-08-18 DIAGNOSIS — W268XXA Contact with other sharp object(s), not elsewhere classified, initial encounter: Secondary | ICD-10-CM | POA: Diagnosis not present

## 2023-08-18 MED ORDER — LIDOCAINE HCL (PF) 1 % IJ SOLN
5.0000 mL | Freq: Once | INTRAMUSCULAR | Status: AC
  Administered 2023-08-18: 5 mL via INTRADERMAL
  Filled 2023-08-18: qty 5

## 2023-08-18 NOTE — Discharge Instructions (Addendum)
 Stitches will need to be removed in 7 to 10 days.  This can be done by primary care, urgent care or the emergency department.  Please wash the wound with soap and water daily, then pat dry and cover with a bandage.  Watch for signs of infection including redness, warmth, swelling, pain and pus drainage.  If you develop any of these please return to the ED, urgent care or your primary care provider.

## 2023-08-18 NOTE — ED Triage Notes (Signed)
 Pt presents to the ED POV from home with laceration to second finger on left hand. Pt reports cutting herself on a razor blade. Wound closes well, but comes apart when pt bends her finger. Bleeding controlled. Pt unsure of last tetanus shot.

## 2023-08-18 NOTE — ED Provider Notes (Signed)
 [facility] Provider Note    Event Date/Time   First MD Initiated Contact with Patient 08/18/23 1646     (approximate)   History   Finger Injury   HPI  Danielle Tran is a 48 y.o. female with PMH of hypertension who presents for evaluation of laceration to the index finger on the left hand.  Patient states she was using a straight razor to clean off some stickers when she cut her fingertip.  Bleeding controlled at this time.  Does not know when her last tetanus shot was.      Physical Exam   Triage Vital Signs: ED Triage Vitals  Encounter Vitals Group     BP 08/18/23 1543 116/84     Systolic BP Percentile --      Diastolic BP Percentile --      Pulse Rate 08/18/23 1543 92     Resp 08/18/23 1543 18     Temp 08/18/23 1543 97.9 F (36.6 C)     Temp Source 08/18/23 1543 Oral     SpO2 08/18/23 1543 97 %     Weight 08/18/23 1546 196 lb (88.9 kg)     Height 08/18/23 1546 5\' 4"  (1.626 m)     Head Circumference --      Peak Flow --      Pain Score 08/18/23 1545 2     Pain Loc --      Pain Education --      Exclude from Growth Chart --     Most recent vital signs: Vitals:   08/18/23 1543 08/18/23 1833  BP: 116/84 117/83  Pulse: 92 87  Resp: 18 16  Temp: 97.9 F (36.6 C)   SpO2: 97% 96%   General: Awake, no distress.  CV:  Good peripheral perfusion.  Resp:  Normal effort.  Abd:  No distention. Other:  Approximately 2 cm laceration to the palmar side of the left index finger, no active bleeding, small amount of subcutaneous fat exposed, finger ROM maintained. Capillary refill less than 2 seconds distal to the laceration.   ED Results / Procedures / Treatments   Labs (all labs ordered are listed, but only abnormal results are displayed) Labs Reviewed - No data to display   PROCEDURES:  Critical Care performed: No  .Laceration Repair  Date/Time: 08/18/2023 6:21 PM  Performed by: Phyliss Breen, PA-C Authorized by:  Phyliss Breen, PA-C   Consent:    Consent obtained:  Verbal   Consent given by:  Patient   Risks, benefits, and alternatives were discussed: yes     Risks discussed:  Infection, poor cosmetic result, pain and poor wound healing   Alternatives discussed:  No treatment Universal protocol:    Patient identity confirmed:  Verbally with patient Anesthesia:    Anesthesia method:  Nerve block   Block location:  Base of left index finger   Block needle gauge:  25 G   Block anesthetic:  Lidocaine  1% w/o epi   Block technique:  Flexor tendon sheath   Block injection procedure:  Introduced needle, anatomic landmarks identified and incremental injection   Block outcome:  Anesthesia achieved Laceration details:    Location:  Finger   Finger location:  L index finger   Length (cm):  1.5   Depth (mm):  2 Pre-procedure details:    Preparation:  Patient was prepped and draped in usual sterile fashion Exploration:    Hemostasis achieved with:  Direct pressure  Wound exploration: wound explored through full range of motion and entire depth of wound visualized     Contaminated: no   Treatment:    Area cleansed with:  Povidone-iodine   Amount of cleaning:  Standard   Irrigation solution:  Sterile saline   Irrigation method:  Syringe Skin repair:    Repair method:  Sutures   Suture size:  5-0   Suture material:  Nylon   Suture technique:  Simple interrupted   Number of sutures:  3 Approximation:    Approximation:  Close Repair type:    Repair type:  Simple Post-procedure details:    Dressing:  Non-adherent dressing and bulky dressing   Procedure completion:  Tolerated well, no immediate complications    MEDICATIONS ORDERED IN ED: Medications  lidocaine  (PF) (XYLOCAINE ) 1 % injection 5 mL (5 mLs Intradermal Given by Other 08/18/23 1827)     IMPRESSION / MDM / ASSESSMENT AND PLAN / ED COURSE  I reviewed the triage vital signs and the nursing notes.                              48 year old female presents for evaluation of laceration to the left index finger.  Vital signs are stable, patient NAD on exam.  Differential diagnosis includes, but is not limited to, laceration, tendon injury, nerve injury.  Patient's presentation is most consistent with acute, uncomplicated illness.  No suspicion for tendon, nerve or vessel injury as patient was neurovascularly intact and finger ROM maintained. Laceration repaired as described in the procedure note above.  Reviewed wound care with the patient.  Stitches will need to be removed in 7-10 days.  Patient declined tetanus shot today.  Patient voiced understanding, all questions were answered and she is stable at discharge.     FINAL CLINICAL IMPRESSION(S) / ED DIAGNOSES   Final diagnoses:  Laceration of left index finger without foreign body without damage to nail, initial encounter     Rx / DC Orders   ED Discharge Orders     None        Note:  This document was prepared using Dragon voice recognition software and may include unintentional dictation errors.   Phyliss Breen, PA-C 08/18/23 Alston Asper, MD 08/18/23 2132
